# Patient Record
Sex: Male | Born: 1955 | Race: Black or African American | Hispanic: No | Marital: Single | Smoking: Never smoker
Health system: Southern US, Community
[De-identification: ages and names within clinical notes are randomized; demographics above are authoritative.]

## PROBLEM LIST (undated history)

## (undated) DIAGNOSIS — H35039 Hypertensive retinopathy, unspecified eye: Secondary | ICD-10-CM

## (undated) DIAGNOSIS — R6250 Unspecified lack of expected normal physiological development in childhood: Secondary | ICD-10-CM

## (undated) DIAGNOSIS — H269 Unspecified cataract: Secondary | ICD-10-CM

## (undated) DIAGNOSIS — G4730 Sleep apnea, unspecified: Secondary | ICD-10-CM

## (undated) DIAGNOSIS — R569 Unspecified convulsions: Secondary | ICD-10-CM

## (undated) DIAGNOSIS — E119 Type 2 diabetes mellitus without complications: Secondary | ICD-10-CM

## (undated) HISTORY — DX: Hypertensive retinopathy, unspecified eye: H35.039

## (undated) HISTORY — DX: Unspecified cataract: H26.9

---

## 2003-01-04 ENCOUNTER — Emergency Department (HOSPITAL_COMMUNITY): Admission: EM | Admit: 2003-01-04 | Discharge: 2003-01-04 | Payer: Self-pay | Admitting: Emergency Medicine

## 2003-01-05 ENCOUNTER — Emergency Department (HOSPITAL_COMMUNITY): Admission: EM | Admit: 2003-01-05 | Discharge: 2003-01-05 | Payer: Self-pay | Admitting: Emergency Medicine

## 2003-01-05 ENCOUNTER — Inpatient Hospital Stay (HOSPITAL_COMMUNITY): Admission: EM | Admit: 2003-01-05 | Discharge: 2003-01-07 | Payer: Self-pay | Admitting: Emergency Medicine

## 2003-01-06 ENCOUNTER — Encounter: Payer: Self-pay | Admitting: *Deleted

## 2012-02-09 DIAGNOSIS — R569 Unspecified convulsions: Secondary | ICD-10-CM | POA: Insufficient documentation

## 2013-02-01 DIAGNOSIS — E7800 Pure hypercholesterolemia, unspecified: Secondary | ICD-10-CM | POA: Insufficient documentation

## 2013-02-01 DIAGNOSIS — R5383 Other fatigue: Secondary | ICD-10-CM | POA: Insufficient documentation

## 2015-06-04 ENCOUNTER — Institutional Professional Consult (permissible substitution): Payer: Self-pay | Admitting: Pulmonary Disease

## 2015-07-17 ENCOUNTER — Encounter: Payer: Self-pay | Admitting: Pulmonary Disease

## 2015-07-17 ENCOUNTER — Ambulatory Visit (INDEPENDENT_AMBULATORY_CARE_PROVIDER_SITE_OTHER)
Admission: RE | Admit: 2015-07-17 | Discharge: 2015-07-17 | Disposition: A | Discharge status: Home / Self Care | Payer: Medicare Other | Source: Ambulatory Visit | Attending: Pulmonary Disease | Admitting: Pulmonary Disease

## 2015-07-17 ENCOUNTER — Ambulatory Visit (INDEPENDENT_AMBULATORY_CARE_PROVIDER_SITE_OTHER): Payer: Medicare Other | Admitting: Pulmonary Disease

## 2015-07-17 ENCOUNTER — Institutional Professional Consult (permissible substitution): Payer: Self-pay | Admitting: Pulmonary Disease

## 2015-07-17 VITALS — BP 128/78 | HR 90 | Ht 66.5 in | Wt 191.0 lb

## 2015-07-17 DIAGNOSIS — R569 Unspecified convulsions: Secondary | ICD-10-CM | POA: Diagnosis not present

## 2015-07-17 DIAGNOSIS — R0600 Dyspnea, unspecified: Secondary | ICD-10-CM

## 2015-07-17 DIAGNOSIS — R0609 Other forms of dyspnea: Secondary | ICD-10-CM | POA: Diagnosis not present

## 2015-07-17 DIAGNOSIS — G4733 Obstructive sleep apnea (adult) (pediatric): Secondary | ICD-10-CM

## 2015-07-17 NOTE — Progress Notes (Addendum)
Subjective:    Patient ID: Roy Young, male    DOB: 1955-04-15, 60 y.o.   MRN: [id number]  HPI   This is the case of Roy Young, 60 y.o. Male, who was referred by Dr. [name] in consultation regarding possible sarcoidosis. He is accompanied today by Lenoria Farrier who has been his caregiver since 1984. She spoke for him.   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 Also has sze problems as a child. He was dxed with OSA in 04/2015. He sees Dr. Rolla Etienne for szes and OSA. He uses cpap > no issues with it.   Szes have been stable. No szes x 3-4 yrs.   Non smoker. Denies any lung issues.  Per Octaviano Glow, sarcoidosis runs in the family and they want him to be checked out.          Review of Systems  Constitutional: Negative.  Negative for fever and unexpected weight change.  HENT: Negative.  Negative for congestion, dental problem, ear pain, nosebleeds, postnasal drip, rhinorrhea, sinus pressure, sneezing, sore throat and trouble swallowing.   Eyes: Negative.  Negative for redness and itching.  Respiratory: Positive for cough and shortness of breath. Negative for chest tightness and wheezing.   Cardiovascular: Negative.  Negative for palpitations and leg swelling.  Gastrointestinal: Negative.  Negative for nausea and vomiting.  Endocrine: Negative.   Genitourinary: Negative.  Negative for dysuria.  Musculoskeletal: Negative.  Negative for joint swelling.  Skin: Negative.  Negative for rash.  Allergic/Immunologic: Negative.   Neurological: Positive for seizures. Negative for headaches.  Hematological: Negative.  Does not bruise/bleed easily.  Psychiatric/Behavioral: Negative.  Negative for dysphoric mood. The patient is not nervous/anxious.    No past medical history on file.  Has IDD, sze d/o, OSA. On cpap since 04/2015.  No family history on file.  Sarcoidosis in mother's side. Father had CA. Mother is deceased. Broithers have  IDD.  Has 4 sisters who are OK.  No past surgical history on file. No surgery.  Social History   Social History  . Marital Status: Single    Spouse Name: N/A  . Number of Children: N/A  . Years of Education: N/A   Occupational History  . Not on file.   Social History Main Topics  . Smoking status: Never Smoker   . Smokeless tobacco: Not on file  . Alcohol Use: Not on file  . Drug Use: Not on file  . Sexual Activity: Not on file   Other Topics Concern  . Not on file   Social History Narrative  . No narrative on file   Single. Not working. Lives in a grp home. (-) smoking,drinking.   Independent with ADLs.   No Known Allergies   No outpatient prescriptions prior to visit.   No facility-administered medications prior to visit.   Meds ordered this encounter  Medications  . Lacosamide (VIMPAT) 100 MG TABS    Sig: Take 100 mg by mouth 2 (two) times daily.  . Multiple Vitamins-Minerals (MULTIVITAMIN ADULT PO)    Sig: Take by mouth.  . ergocalciferol (VITAMIN D2) 50000 units capsule    Sig: Take 50,000 Units by mouth once a week.  . lovastatin (MEVACOR) 40 MG tablet    Sig: Take 40 mg by mouth at bedtime.  Marland Kitchen loratadine (CLARITIN) 10 MG tablet    Sig: Take 10 mg by mouth daily.  Marland Kitchen levETIRAcetam (KEPPRA) 500 MG tablet  Sig: Take 500 mg by mouth 2 (two) times daily.  . carbamazepine (CARBATROL) 300 MG 12 hr capsule    Sig: Take 300 mg by mouth 2 (two) times daily.  . divalproex (DEPAKOTE) 250 MG DR tablet    Sig: Take 750 mg by mouth 3 (three) times daily.  . sertraline (ZOLOFT) 50 MG tablet    Sig: Take 50 mg by mouth daily.           Objective:   Physical Exam Vitals:  Filed Vitals:   07/17/15 1455  BP: 128/78  Pulse: 90  Height: 5' 6.5" (1.689 m)  Weight: 191 lb (86.637 kg)  SpO2: 93%    Constitutional/General:  Pleasant, well-nourished, well-developed, not in any distress,  Comfortably seating.  Well kempt  Body mass index is 30.37  kg/(m^2). Wt Readings from Last 3 Encounters:  07/17/15 191 lb (86.637 kg)      HEENT: Pupils equal and reactive to light and accommodation. Anicteric sclerae. Normal nasal mucosa.   No oral  lesions,  mouth clear,  oropharynx clear, no postnasal drip. (-) Oral thrush. No dental caries.  Airway - Mallampati class III  Neck: No masses. Midline trachea. No JVD, (-) LAD. (-) bruits appreciated.  Respiratory/Chest: Grossly normal chest. (-) deformity. (-) Accessory muscle use.  Symmetric expansion. (-) Tenderness on palpation.  Resonant on percussion.  Diminished BS on both lower lung zones. (-) wheezing, crackles, rhonchi (-) egophony  Cardiovascular: Regular rate and  rhythm, heart sounds normal, no murmur or gallops, no peripheral edema  Gastrointestinal:  Normal bowel sounds. Soft, non-tender. No hepatosplenomegaly.  (-) masses.   Musculoskeletal:  Normal muscle tone. Normal gait.   Extremities: Grossly normal. (-) clubbing, cyanosis.  (-) edema  Skin: (-) rash,lesions seen.   Neurological/Psychiatric : alert, oriented to time, place, person. Normal mood and affect             Assessment & Plan:  Exertional dyspnea Pt with recent SOB. Not sure if related to weight/RVD. Recent dx of OSA on cpap. Sarcoidosis runs in the family. Plan : 1. Plan for CXR. If abn, will need a chest ct scan.  Sister wants pt to be worked up for Sarcoidosis. If (+), will need a diagnostic bronch and will need blood work. If CXR is (-), maybe rpt CXR in 1 yr.   Seizure ([place]) Stable. Being seen by [facility]rology. sze free x 3 yrs.   OSA (obstructive sleep apnea) Mild OSA. AHI 10. On cpap of 8 cm H2O.  On cpap. [facility] neurology follows. Feels better using it. More energy. Less sleepiness.     Thank you very much for letting me participate in this patient's care. Please do not hesitate to give me a call if you have any questions or concerns regarding the treatment  plan.   Patient will follow up with me in 6 mos, sooner if Chest Xray is (+).     Monica Becton, MD 07/17/2015   4:09 PM [facility] Pager: [phone] Office: [phone], Fax: [phone]

## 2015-07-17 NOTE — Assessment & Plan Note (Signed)
Stable. Being seen by [facility] Neurology. sze free x 3 yrs.

## 2015-07-17 NOTE — Patient Instructions (Addendum)
1. We will order you a chest Xray and call you with results. Most likely, we will order you a chest CT scan if the x-ray is abnormal.  Return to clinic in 6 mos.

## 2015-07-17 NOTE — Assessment & Plan Note (Addendum)
Pt with recent SOB. Not sure if related to weight/RVD. Recent dx of OSA on cpap. Sarcoidosis runs in the family. Plan : 1. Plan for CXR. If abn, will need a chest ct scan.  Sister wants pt to be worked up for Sarcoidosis. If (+), will need a diagnostic bronch and will need blood work. If CXR is (-), maybe rpt CXR in 1 yr.

## 2015-07-17 NOTE — Assessment & Plan Note (Addendum)
Mild OSA. AHI 10. On cpap of 8 cm H2O.  On cpap. [facility] neurology follows. Feels better using it. More energy. Less sleepiness.

## 2016-01-30 ENCOUNTER — Encounter: Payer: Self-pay | Admitting: Pulmonary Disease

## 2016-01-30 ENCOUNTER — Ambulatory Visit (INDEPENDENT_AMBULATORY_CARE_PROVIDER_SITE_OTHER): Payer: Medicare Other | Admitting: Pulmonary Disease

## 2016-01-30 VITALS — BP 122/78 | HR 72 | Ht 66.5 in | Wt 192.0 lb

## 2016-01-30 DIAGNOSIS — R0609 Other forms of dyspnea: Secondary | ICD-10-CM | POA: Diagnosis not present

## 2016-01-30 DIAGNOSIS — G4733 Obstructive sleep apnea (adult) (pediatric): Secondary | ICD-10-CM

## 2016-01-30 NOTE — Progress Notes (Signed)
Subjective:    Patient ID: Roy Young, male    DOB: 1956/01/22, 60 y.o.   MRN: [id number]  HPI   This is the case of Roy Young, 60 y.o. Male, who was referred by Dr. Nolene Young in consultation regarding possible sarcoidosis. He is accompanied today by Roy Young who has been his caregiver since 1984. She spoke for him.   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 Also has sze problems as a child. He was dxed with OSA in 04/2015. He sees Roy Young for szes and OSA. He uses cpap > no issues with it.   Szes have been stable. No szes x 3-4 yrs.   Non smoker. Denies any lung issues.  Per Roy Young, sarcoidosis runs in the family and they want him to be checked out.    ROV 01/30/16 Patient returns to the office as follow-up on his "dyspnea". His brother accompanied him today. Per him, patient's dyspnea is significantly better. No issues overall. Has not been admitted nor been on antibiotics since last seen. Chest x-ray was unremarkable so no further tests were done. Patient has sleep apnea and uses CPAP. Per brother, Roy Young follows him for sleep apnea. Recent hypersomnia. No follow-up with Young recently. No seizures recently.   Review of Systems  Constitutional: Negative.  Negative for fever and unexpected weight change.  HENT: Negative.  Negative for congestion, dental problem, ear pain, nosebleeds, postnasal drip, rhinorrhea, sinus pressure, sneezing, sore throat and trouble swallowing.   Eyes: Negative.  Negative for redness and itching.  Respiratory: Occasional cough and dyspnea. Negative for chest tightness and wheezing.   Cardiovascular: Negative.  Negative for palpitations and leg swelling.  Gastrointestinal: Negative.  Negative for nausea and vomiting.  Endocrine: Negative.   Genitourinary: Negative.  Negative for dysuria.  Musculoskeletal: Negative.  Negative for joint swelling.  Skin: Negative.  Negative for  rash.  Allergic/Immunologic: Negative.   Neurological: Negative for seizures.. Negative for headaches.  Hematological: Negative.  Does not bruise/bleed easily.  Psychiatric/Behavioral: Negative.  Negative for dysphoric mood. The patient is not nervous/anxious.        Objective:   Physical Exam Vitals:  Vitals:   01/30/16 1131  BP: 122/78  Pulse: 72  SpO2: 98%  Weight: 192 lb (87.1 kg)  Height: 5' 6.5" (1.689 m)    Constitutional/General:  Pleasant, well-nourished, well-developed, not in any distress,  Comfortably seating.  Well kempt  Body mass index is 30.53 kg/m. Wt Readings from Last 3 Encounters:  01/30/16 192 lb (87.1 kg)  07/17/15 191 lb (86.6 kg)      HEENT: Pupils equal and reactive to light and accommodation. Anicteric sclerae. Normal nasal mucosa.   No oral  lesions,  mouth clear,  oropharynx clear, no postnasal drip. (-) Oral thrush. No dental caries.  Airway - Mallampati class III-IV  Neck: No masses. Midline trachea. No JVD, (-) LAD. (-) bruits appreciated.  Respiratory/Chest: Grossly normal chest. (-) deformity. (-) Accessory muscle use.  Symmetric expansion. (-) Tenderness on palpation.  Resonant on percussion.  Diminished BS on both lower lung zones. (-) wheezing, crackles, rhonchi (-) egophony  Cardiovascular: Regular rate and  rhythm, heart sounds normal, no murmur or gallops, no peripheral edema  Gastrointestinal:  Normal bowel sounds. Soft, non-tender. No hepatosplenomegaly.  (-) masses.   Musculoskeletal:  Normal muscle tone. Normal gait.   Extremities: Grossly normal. (-) clubbing, cyanosis.  (-) edema  Skin: (-) rash,lesions seen.  Neurological/Psychiatric : alert, followed simple commands.  Normal mood and affect.      Assessment & Plan:  Exertional dyspnea Pt with recent very mild SOB. Not sure if related to weight/RVD. Shortness of breath has been stable the last 6 months. Sarcoidosis runs in the family. Chest x-ray May  2017: Unremarkable. No significant lymphadenopathy seen.  Plan : 1. "Exertional dyspnea" is stable. Likely related to restrictive ventilatory defect/obesity. Although sarcoidosis runs in the family, chest x-ray in May, 2017 did not show any evidence of sarcoidosis. We held off on chest CT scan. Besides, he is not too symptomatic anyways. Plan to observe "exertional dyspnea" for now. Patient's sister wants him to be followed up at least once yearly. She is concerned about sarcoidosis. Maybe rpt  CXR on f/u.   Seizure (Roy Young) Stable. Being seen by Roy Young Young. sze free x 3 yrs.   OSA (obstructive sleep apnea) Mild OSA. AHI 10. On cpap of 8 cm H2O.  On cpap. Roy Young Young follows. Feels better using it. More energy. Recent sleepiness and hypersomnia per brother. I mentioned to his  brother to make sure he follows up with Roy Young who takes Young of his CPAP. We'll need a download and maybe adjusting his settings.   Patient will follow up with me in 12 mos.   Roy Becton, MD 01/30/2016, 11:59 AM Roy Young Pager [phone] After 3 pm or if no answer, call [phone]

## 2016-01-30 NOTE — Patient Instructions (Signed)
  It was a pleasure taking care of you today!  Continue using your CPAP machine. Follow up with Neurology regarding your OSA.   Please make sure you use your CPAP device everytime you sleep.   Return to clinic in 1 year  with Dr. Corrie Dandy.

## 2016-11-29 ENCOUNTER — Emergency Department (HOSPITAL_COMMUNITY): Payer: Medicare Other

## 2016-11-29 ENCOUNTER — Encounter (HOSPITAL_COMMUNITY): Payer: Self-pay | Admitting: Emergency Medicine

## 2016-11-29 ENCOUNTER — Emergency Department (HOSPITAL_COMMUNITY)
Admission: EM | Admit: 2016-11-29 | Discharge: 2016-11-30 | Disposition: A | Discharge status: Home / Self Care | Payer: Medicare Other | Attending: Emergency Medicine | Admitting: Emergency Medicine

## 2016-11-29 DIAGNOSIS — R2689 Other abnormalities of gait and mobility: Secondary | ICD-10-CM | POA: Insufficient documentation

## 2016-11-29 DIAGNOSIS — Z79899 Other long term (current) drug therapy: Secondary | ICD-10-CM | POA: Diagnosis not present

## 2016-11-29 DIAGNOSIS — S0990XA Unspecified injury of head, initial encounter: Secondary | ICD-10-CM | POA: Diagnosis not present

## 2016-11-29 DIAGNOSIS — Y999 Unspecified external cause status: Secondary | ICD-10-CM | POA: Diagnosis not present

## 2016-11-29 DIAGNOSIS — W07XXXA Fall from chair, initial encounter: Secondary | ICD-10-CM | POA: Insufficient documentation

## 2016-11-29 DIAGNOSIS — F79 Unspecified intellectual disabilities: Secondary | ICD-10-CM | POA: Diagnosis not present

## 2016-11-29 DIAGNOSIS — Y9389 Activity, other specified: Secondary | ICD-10-CM | POA: Insufficient documentation

## 2016-11-29 DIAGNOSIS — E119 Type 2 diabetes mellitus without complications: Secondary | ICD-10-CM | POA: Insufficient documentation

## 2016-11-29 DIAGNOSIS — W19XXXA Unspecified fall, initial encounter: Secondary | ICD-10-CM

## 2016-11-29 DIAGNOSIS — Y9210 Unspecified residential institution as the place of occurrence of the external cause: Secondary | ICD-10-CM | POA: Insufficient documentation

## 2016-11-29 HISTORY — DX: Unspecified lack of expected normal physiological development in childhood: R62.50

## 2016-11-29 HISTORY — DX: Unspecified convulsions: R56.9

## 2016-11-29 HISTORY — DX: Type 2 diabetes mellitus without complications: E11.9

## 2016-11-29 NOTE — ED Triage Notes (Signed)
Pt from home with c/o head pain on left temporal area following fall earlier this evening. Pt from group home where staff witnessed fall earlier. Pt does not respond as if in pain when area is palpated and there is no crepitus or swelling. Pt does not take blood thinners and did not experience LOC

## 2016-11-30 NOTE — ED Notes (Signed)
Pt ambulated to bathroom and back without any difficulties

## 2016-11-30 NOTE — Discharge Instructions (Signed)
CT scan of head and neck showed no fractures or head bleeding. 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 This could be causing his balance issues however may be due to medications. Make sure to follow up with her primary doctor [facility] appointment on Tuesday. Return to the ED if he develops any worsening symptoms.

## 2016-11-30 NOTE — ED Provider Notes (Signed)
[place] DEPT Provider Note   CSN: [id number] Arrival date & time: 11/29/16  1853     History   Chief Complaint Chief Complaint  Patient presents with  . Fall    HPI Roy Young is a 61 y.o. male.  HPI 61 year old African-American male past medical history significant for developmental delay, diabetes, seizures that presents to the emergency department today from a group home where he had a witnessed fall. Staff at bedside states the patient was getting up from dinner when he lost his balance and fell down hitting the left side of his head on the table. Patient has been ambulatory since the event. Denies any LOC or blood thinner use. Patient complain of head pain and left temporal area following the fall earlier. The caregiver says that over the past few weeks patient has been more off balance. She feels the patient is being overmedicated with his seizure medications. Denies any associated headaches, vision changes, lightheadedness, dizziness, seizure-like activity, aphasia, ataxia. Patient denies any neck pain, back pain, chest pain, abdominal pain Past Medical History:  Diagnosis Date  . Developmental delay   . Diabetes mellitus without complication ([place])   . Seizures [facility])     Patient Active Problem List   Diagnosis Date Noted  . Exertional dyspnea 07/17/2015  . OSA (obstructive sleep apnea) 07/17/2015  . Fatigue 02/01/2013  . Hypercholesterolemia 02/01/2013  . Seizure ([place]) 02/09/2012    History reviewed. No pertinent surgical history.     Home Medications    Prior to Admission medications   Medication Sig Start Date End Date Taking? Authorizing Provider  carbamazepine (CARBATROL) 300 MG 12 hr capsule Take 300 mg by mouth 2 (two) times daily.    [provider]  divalproex (DEPAKOTE) 250 MG DR tablet Take 750 mg by mouth 3 (three) times daily.    [provider]  ergocalciferol (VITAMIN D2) 50000 units capsule Take 50,000 Units by mouth  once a week.    [provider]  Lacosamide (VIMPAT) 100 MG TABS Take 100 mg by mouth 2 (two) times daily.    [provider]  levETIRAcetam (KEPPRA) 500 MG tablet Take 500 mg by mouth 2 (two) times daily.    [provider]  loratadine (CLARITIN) 10 MG tablet Take 10 mg by mouth daily.    [provider]  lovastatin (MEVACOR) 40 MG tablet Take 40 mg by mouth at bedtime.    [provider]  Multiple Vitamins-Minerals (MULTIVITAMIN ADULT PO) Take by mouth.    [provider]  sertraline (ZOLOFT) 50 MG tablet Take 50 mg by mouth daily.    [provider]    Family History No family history on file.  Social History Social History  Substance Use Topics  . Smoking status: Never Smoker  . Smokeless tobacco: Not on file  . Alcohol use No     Allergies   Patient has no known allergies.   Review of Systems Review of Systems  Constitutional: Negative for chills and fever.  Eyes: Negative for visual disturbance.  Cardiovascular: Negative for chest pain.  Gastrointestinal: Negative for abdominal pain.  Musculoskeletal: Positive for gait problem. Negative for arthralgias, back pain, joint swelling, myalgias, neck pain and neck stiffness.  Skin: Negative for color change and wound.  Neurological: Positive for headaches. Negative for dizziness, seizures, syncope, weakness, light-headedness and numbness.     Physical Exam Updated Vital Signs BP (!) 152/93 (BP Location: Right Arm)   Pulse 71   Temp  98.8 F (37.1 C) (Oral)   Resp 18   Ht 5\' 6"  (1.676 m)   Wt 83.9 kg (185 lb)   SpO2 97%   BMI 29.86 kg/m   Physical Exam Physical Exam  Constitutional: Pt is oriented to person, place, and time. Appears well-developed and well-nourished. No distress.  HENT:  Head: Normocephalic and atraumatic.  Ears: No bilateral hemotympanum. Nose: Nose normal. No septal hematoma. Mouth/Throat: Uvula is midline, oropharynx is clear and  moist and mucous membranes are normal.  Eyes: Conjunctivae and EOM are normal. Pupils are equal, round, and reactive to light.  Neck: No spinous process tenderness and no muscular tenderness present. No rigidity. Normal range of motion present.  Full ROM without pain No midline cervical tenderness No crepitus, deformity or step-offs  No paraspinal tenderness  Cardiovascular: Normal rate, regular rhythm and intact distal pulses.   Pulses:      Radial pulses are 2+ on the right side, and 2+ on the left side.       Dorsalis pedis pulses are 2+ on the right side, and 2+ on the left side.       Posterior tibial pulses are 2+ on the right side, and 2+ on the left side.  Pulmonary/Chest: Effort normal and breath sounds normal. No accessory muscle usage. No respiratory distress. No decreased breath sounds. No wheezes. No rhonchi. No rales. Exhibits no tenderness and no bony tenderness.   No flail segment, crepitus or deformity Equal chest expansion  Abdominal: Soft. Normal appearance and bowel sounds are normal. There is no tenderness. There is no rigidity, no guarding and no CVA tenderness.   Abd soft and nontender  Musculoskeletal: Normal range of motion.       Thoracic back: Exhibits normal range of motion.       Lumbar back: Exhibits normal range of motion.  Full range of motion of the T-spine and L-spine No tenderness to palpation of the spinous processes of the T-spine or L-spine No crepitus, deformity or step-offs No tenderness to palpation of the paraspinous muscles of the L-spine  Pelvis is stable. Lymphadenopathy:    Pt has no cervical adenopathy.  Neurological: Pt is alert and oriented to person, place, but not which is normal per facility staff member. Normal reflexes. No cranial nerve deficit. GCS eye subscore is 4. GCS verbal subscore is 5. GCS motor subscore is 6.  Reflex Scores:      Bicep reflexes are 2+ on the right side and 2+ on the left side.      Brachioradialis reflexes  are 2+ on the right side and 2+ on the left side.      Patellar reflexes are 2+ on the right side and 2+ on the left side.      Achilles reflexes are 2+ on the right side and 2+ on the left side. Speech is clear and goal oriented, follows commands Normal 5/5 strength in upper and lower extremities bilaterally including dorsiflexion and plantar flexion, strong and equal grip strength Sensation normal to light and sharp touch Moves extremities without ataxia, coordination intact Normal gait and balance No Clonus  Skin: Skin is warm and dry. No rash noted. Pt is not diaphoretic. No erythema.  Psychiatric: Normal mood and affect.  Nursing note and vitals reviewed.     ED Treatments / Results  Labs (all labs ordered are listed, but only abnormal results are displayed) Labs Reviewed - No data to display  EKG  EKG Interpretation None  Radiology Ct Head Wo Contrast  Result Date: 11/30/2016 CLINICAL DATA:  Witnessed fall in group home. No loss of consciousness. LEFT head pain. EXAM: CT HEAD WITHOUT CONTRAST CT CERVICAL SPINE WITHOUT CONTRAST TECHNIQUE: Multidetector CT imaging of the head and cervical spine was performed following the standard protocol without intravenous contrast. Multiplanar CT image reconstructions of the cervical spine were also generated. COMPARISON:  CT HEAD January 05, 2003 FINDINGS: CT HEAD FINDINGS BRAIN: No intraparenchymal hemorrhage, mass effect nor midline shift. Small area LEFT frontal encephalomalacia stable from 2004. New RIGHT occipital lobe encephalomalacia. Mild parenchymal brain volume loss. No hydrocephalus. Patchy supratentorial white matter hypodensities. No acute large vascular territory infarct. No abnormal extra-axial fluid collections. VASCULAR: Moderate calcific atherosclerosis of the carotid siphons. SKULL: No skull fracture. No significant scalp soft tissue swelling. SINUSES/ORBITS: The mastoid air-cells and included paranasal sinuses are  well-aerated.The included ocular globes and orbital contents are non-suspicious. OTHER: None. CT CERVICAL SPINE FINDINGS ALIGNMENT: Straightened lordosis. Vertebral bodies in alignment. SKULL BASE AND VERTEBRAE: Cervical vertebral bodies and posterior elements are intact. Intervertebral disc heights preserved. No destructive bony lesions. C1-2 articulation maintained. Mild LEFT mid cervical facet arthropathy. SOFT TISSUES AND SPINAL CANAL: Nonacute. Mild calcific atherosclerosis carotid bifurcations. DISC LEVELS: No significant osseous canal stenosis. Moderate C7-T1 neural foraminal narrowing. UPPER CHEST: Lung apices are clear. OTHER: None. IMPRESSION: CT HEAD: 1. No acute intracranial process. 2. 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 3. Mild parenchymal brain volume loss. 4. Mild chronic small vessel ischemic disease. CT CERVICAL SPINE: 1. No acute fracture or malalignment. Electronically Signed   By: Elon Alas M.D.   On: 11/30/2016 00:36   Ct Cervical Spine Wo Contrast  Result Date: 11/30/2016 CLINICAL DATA:  Witnessed fall in group home. No loss of consciousness. LEFT head pain. EXAM: CT HEAD WITHOUT CONTRAST CT CERVICAL SPINE WITHOUT CONTRAST TECHNIQUE: Multidetector CT imaging of the head and cervical spine was performed following the standard protocol without intravenous contrast. Multiplanar CT image reconstructions of the cervical spine were also generated. COMPARISON:  CT HEAD January 05, 2003 FINDINGS: CT HEAD FINDINGS BRAIN: No intraparenchymal hemorrhage, mass effect nor midline shift. Small area LEFT frontal encephalomalacia stable from 2004. New RIGHT occipital lobe encephalomalacia. Mild parenchymal brain volume loss. No hydrocephalus. Patchy supratentorial white matter hypodensities. No acute large vascular territory infarct. No abnormal extra-axial fluid collections. VASCULAR: Moderate calcific atherosclerosis of the carotid  siphons. SKULL: No skull fracture. No significant scalp soft tissue swelling. SINUSES/ORBITS: The mastoid air-cells and included paranasal sinuses are well-aerated.The included ocular globes and orbital contents are non-suspicious. OTHER: None. CT CERVICAL SPINE FINDINGS ALIGNMENT: Straightened lordosis. Vertebral bodies in alignment. SKULL BASE AND VERTEBRAE: Cervical vertebral bodies and posterior elements are intact. Intervertebral disc heights preserved. No destructive bony lesions. C1-2 articulation maintained. Mild LEFT mid cervical facet arthropathy. SOFT TISSUES AND SPINAL CANAL: Nonacute. Mild calcific atherosclerosis carotid bifurcations. DISC LEVELS: No significant osseous canal stenosis. Moderate C7-T1 neural foraminal narrowing. UPPER CHEST: Lung apices are clear. OTHER: None. IMPRESSION: CT HEAD: 1. No acute intracranial process. 2. 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 3. Mild parenchymal brain volume loss. 4. Mild chronic small vessel ischemic disease. CT CERVICAL SPINE: 1. No acute fracture or malalignment. Electronically Signed   By: Elon Alas M.D.   On: 11/30/2016 00:36    Procedures Procedures (including critical care time)  Medications Ordered in ED Medications - No data to display  Initial Impression / Assessment and Plan / ED Course  I have reviewed the triage vital signs and the nursing notes.  Pertinent labs & imaging results that were available during my care of the patient were reviewed by me and considered in my medical decision making (see chart for details).     Patient presents to the ED from a group home with staff at bedside for a witnessed fall. Patient complains of a headache. No other focal neuro deficits on exam. Patient is neurovascularly intact in all extremities. Staff member feels that patient's balance has been off for the past 3-4 weeks and she feels like he is being overmedicated.  Patient was ambulated in the ED without any difficulties. CT scan of head and neck were obtained. Showed no acute abnormality does show old infarct. Patient and nursing staff was updated on the findings. This may be a cause of patient being off balance however could be due to medication. Has an appointment with his PCP on Tuesday. Encouraged him to keep his appointment and voice their concerns with his medications and his CT findings. Patient does have a neurologist. Vital signs are reassuring. Patient is overall well-appearing and nontoxic. Ambulate with normal gait. No signs of intrathoracic or intra-abdominal trauma.  Pt is hemodynamically stable, in NAD, & able to ambulate in the ED. Evaluation does not show pathology that would require ongoing emergent intervention or inpatient treatment. I explained the diagnosis to the patient. Pain has been managed & has no complaints prior to dc. Pt is comfortable with above plan and is stable for discharge at this time. All questions were answered prior to disposition. Strict return precautions for f/u to the ED were discussed. Encouraged follow up with PCP.  Patient seen and evaluated with my attending Dr. Randal Buba who is agreeable to the above plan.    Final Clinical Impressions(s) / ED Diagnoses   Final diagnoses:  Fall, initial encounter  Injury of head, initial encounter    New Prescriptions New Prescriptions   No medications on file     Aaron Edelman 11/30/16 0159    Palumbo, April, MD 11/30/16 [phone]

## 2016-11-30 NOTE — ED Notes (Signed)
Pt ambulated well in the hall and to the bathroom without difficulty---- pt's gait and balance steady.

## 2017-02-09 ENCOUNTER — Encounter: Payer: Self-pay | Admitting: Adult Health

## 2017-02-09 ENCOUNTER — Ambulatory Visit (INDEPENDENT_AMBULATORY_CARE_PROVIDER_SITE_OTHER): Payer: Medicare Other | Admitting: Adult Health

## 2017-02-09 VITALS — BP 116/74 | HR 75 | Ht 67.0 in | Wt 171.8 lb

## 2017-02-09 DIAGNOSIS — G4733 Obstructive sleep apnea (adult) (pediatric): Secondary | ICD-10-CM

## 2017-02-09 NOTE — Progress Notes (Signed)
@Patient  ID: Roy Young, male    DOB: [birth year], 61 y.o.   MRN: [id number]  Chief Complaint  Patient presents with  . Follow-up    Referring provider: [name], MD  HPI: 61 yo male with seizure disorder and mentally challenged followed for OSA on CPAP   02/09/2017 Follow up: CPAP  Pt presents for a 6 month follow up . He is accompanied by his caregiver. He is at a group home. She says he does very well on CPAP . Sleeps each night with CPAP on all night . He appears rested in am. No issues with mask or machine. Gets supplies on time with DME.  No download available.    No Known Allergies  Immunization History  Administered Date(s) Administered  . Influenza [place] 03/27/2015, 12/14/2016    Past Medical History:  Diagnosis Date  . Developmental delay   . Diabetes mellitus without complication ([place])   . Seizures ([place])     Tobacco History: Social History   Tobacco Use  Smoking Status Never Smoker  Smokeless Tobacco Never Used   Counseling given: Not Answered   Outpatient Encounter Medications as of 02/09/2017  Medication Sig  . carbamazepine (CARBATROL) 300 MG 12 hr capsule Take 300 mg by mouth 2 (two) times daily.  . divalproex (DEPAKOTE) 250 MG DR tablet Take 750 mg by mouth 3 (three) times daily.  . ergocalciferol (VITAMIN D2) 50000 units capsule Take 50,000 Units by mouth once a week.  . Lacosamide (VIMPAT) 100 MG TABS Take 100 mg by mouth 2 (two) times daily.  Marland Kitchen levETIRAcetam (KEPPRA) 500 MG tablet Take 500 mg by mouth 2 (two) times daily.  Marland Kitchen loratadine (CLARITIN) 10 MG tablet Take 10 mg by mouth daily.  Marland Kitchen lovastatin (MEVACOR) 40 MG tablet Take 40 mg by mouth at bedtime.  . Multiple Vitamins-Minerals (MULTIVITAMIN ADULT PO) Take by mouth.  . sertraline (ZOLOFT) 50 MG tablet Take 50 mg by mouth daily.   No facility-administered encounter medications on file as of 02/09/2017.      Review of Systems  Constitutional:   No  weight loss, night  sweats,  Fevers, chills, fatigue, or  lassitude.    Physical Exam  BP 116/74 (BP Location: Right Arm, Cuff Size: Normal)   Pulse 75   Ht 5\' 7"  (1.702 m)   Wt 171 lb 12.8 oz (77.9 kg)   SpO2 100%   BMI 26.91 kg/m   GEN: A/Ox3; pleasant , NAD , mentally challenged, limited speech    HEENT:  [place]/AT,  THROAT-clear, no lesions, no postnasal drip or exudate noted. Class 3 MP airway , hearing aids  NECK:  Supple w/ fair ROM; no JVD; normal carotid impulses w/o bruits; no thyromegaly or nodules palpated; no lymphadenopathy.    RESP  Clear  P & A; w/o, wheezes/ rales/ or rhonchi. no accessory muscle use, no dullness to percussion  CARD:  RRR, no m/r/g, no peripheral edema, pulses intact, no cyanosis or clubbing.  GI:   Soft & nt; nml bowel sounds; no organomegaly or masses detected.   Musco: Warm bil, no deformities or joint swelling noted.   Skin: Warm, no lesions or rashes    Lab Results:  CBC No results found for: WBC, RBC, HGB, HCT, PLT, MCV, MCH, MCHC, RDW, LYMPHSABS, MONOABS, EOSABS, BASOSABS  BMET No results found for: NA, K, CL, CO2, GLUCOSE, BUN, CREATININE, CALCIUM, GFRNONAA, GFRAA  BNP No results found for: BNP  ProBNP No results found for: PROBNP  Imaging: No results found.   Assessment & Plan:   OSA (obstructive sleep apnea) Well controlled on CPAP  Download requested   Plan  Patient Instructions  Continue on CPAP At bedtime   Keep up good work.  Download and SD card have been requested.  Follow up with Dr. Elsworth Soho  In 1 year and As needed          Rexene Edison, NP 02/09/2017

## 2017-02-09 NOTE — Patient Instructions (Signed)
Continue on CPAP At bedtime   Keep up good work.  Download and SD card have been requested.  Follow up with Dr. Elsworth Soho  In 1 year and As needed

## 2017-02-09 NOTE — Assessment & Plan Note (Signed)
Well controlled on CPAP  Download requested   Plan  Patient Instructions  Continue on CPAP At bedtime   Keep up good work.  Download and SD card have been requested.  Follow up with Dr. Elsworth Soho  In 1 year and As needed

## 2017-02-14 ENCOUNTER — Encounter: Payer: Self-pay | Admitting: Adult Health

## 2017-02-19 ENCOUNTER — Telehealth: Payer: Self-pay | Admitting: Adult Health

## 2017-02-19 NOTE — Telephone Encounter (Signed)
Pt seen by TP on 11.27.18 for OSA follow up - download was unavailable Received 01/16/17 - 02/14/17 download from [facility] Per TP: control ok, no changes.  Does show some mask leaks.  LMOM TCB x1 for pt to discuss results

## 2017-02-24 NOTE — Telephone Encounter (Signed)
Pt lives in a group home Called spoke with his caretaker Ms. Coltrane who accompanied pt to his appt She did receive the message, discussed with her again Nothing further needed; will sign off

## 2018-02-01 ENCOUNTER — Ambulatory Visit: Payer: Medicare Other | Admitting: Pulmonary Disease

## 2018-02-08 ENCOUNTER — Ambulatory Visit: Payer: Medicare Other | Admitting: Pulmonary Disease

## 2018-03-29 ENCOUNTER — Ambulatory Visit: Payer: Medicare Other | Admitting: Pulmonary Disease

## 2018-04-05 ENCOUNTER — Ambulatory Visit: Payer: Medicare Other | Admitting: Pulmonary Disease

## 2018-04-26 ENCOUNTER — Ambulatory Visit: Payer: Medicare Other | Admitting: Pulmonary Disease

## 2018-05-10 ENCOUNTER — Ambulatory Visit: Payer: Medicare Other | Admitting: Pulmonary Disease

## 2018-05-24 ENCOUNTER — Encounter: Payer: Self-pay | Admitting: Pulmonary Disease

## 2018-05-24 ENCOUNTER — Ambulatory Visit (INDEPENDENT_AMBULATORY_CARE_PROVIDER_SITE_OTHER): Payer: Medicare HMO | Admitting: Pulmonary Disease

## 2018-05-24 VITALS — BP 112/62 | HR 74 | Ht 67.0 in | Wt 180.0 lb

## 2018-05-24 DIAGNOSIS — G4733 Obstructive sleep apnea (adult) (pediatric): Secondary | ICD-10-CM | POA: Diagnosis not present

## 2018-05-24 NOTE — Assessment & Plan Note (Signed)
CPAP machine is on auto settings 8-14 cm  and is working well. CPAP supplies will be renewed for a year  Weight loss encouraged, compliance with goal of at least 4-6 hrs every night is the expectation. Advised against medications with sedative side effects Cautioned against driving when sleepy - understanding that sleepiness will vary on a day to day basis

## 2018-05-24 NOTE — Progress Notes (Signed)
   Subjective:    Patient ID: Roy Young, male    DOB: June 12, 1955, 63 y.o.   MRN: [id number]  HPI  63 yo group home resident with seizure disorder and mild MR followed for OSA on CPAP  Per report, his PSG showed mild OSA AHI 10/hour. He has done well on CPAP, auto settings 8 to 14 cm with full facemask. He lives with a group home and is accompanied by his Freight forwarder today. He is very compliant with this machine and this has really helped him.  He does take daytime naps in his recliner and his snoring is loud enough to where everyone can hear.  He is able to wear the mask by himself but he has severe tardive dyskinesia and he needs someone to put water into his humidifier. CPAP download shows excellent compliance only 3 missed nights out of last month with average pressure 11 cm and good control of events with moderate leak.  Seizures are controlled on 4 medications for the past few years He was evaluated for sarcoidosis and chest x-ray noted to be normal 07/2015  Past Medical History:  Diagnosis Date  . Developmental delay   . Diabetes mellitus without complication ([place])   . Seizures ([place])      Review of Systems neg for any significant sore throat, dysphagia, itching, sneezing, nasal congestion or excess/ purulent secretions, fever, chills, sweats, unintended wt loss, pleuritic or exertional cp, hempoptysis, orthopnea pnd or change in chronic leg swelling. Also denies presyncope, palpitations, heartburn, abdominal pain, nausea, vomiting, diarrhea or change in bowel or urinary habits, dysuria,hematuria, rash, arthralgias, visual complaints, headache, numbness weakness or ataxia.     Objective:   Physical Exam   Gen. Pleasant, well-nourished, in no distress ENT - no thrush, no pallor/icterus,no post nasal drip, class 2 airway  Neck: No JVD, no thyromegaly, no carotid bruits Lungs: no use of accessory muscles, no dullness to percussion, clear without rales or rhonchi    Cardiovascular: Rhythm regular, heart sounds  normal, no murmurs or gallops, no peripheral edema Musculoskeletal: No deformities, no cyanosis or clubbing  Neuro-tremors, cogwheel rigidity, no deformity        Assessment & Plan:

## 2018-05-24 NOTE — Patient Instructions (Signed)
CPAP machine is on auto settings and is working well. CPAP supplies will be renewed for a year

## 2018-11-28 NOTE — Progress Notes (Signed)
[facility] Note  11/29/2018     CHIEF COMPLAINT Patient presents for Retina Evaluation   HISTORY OF PRESENT ILLNESS: Roy Young is a 63 y.o. male who presents to the clinic today for:   HPI    Retina Evaluation    In right eye.  Onset: Unknown.  Duration of 2 weeks.  Associated Symptoms Distortion.  Context:  distance vision, mid-range vision and near vision.  Treatments tried include no treatments.  I, the attending physician,  performed the HPI with the patient and updated documentation appropriately.          Comments    63 y/o male pt referred by Dr. Parke Simmers for eval of ERM w/mac hole OD.  Saw Dr. Parke Simmers about 2 wks ago for routine eye exam when issue was discovered.  Pt reports no issues with his vision, and denies pain, flashes, floaters, but according to caregiver, who is with patient today, pt is mostly non-verbal due to cognitive issues.  No gtts.       Last edited by [name], MD on 11/29/2018 11:02 PM. (History)    pt is with his caregiver today, she states he lives in a group home, but is his own POA, pt is mostly non-verbal due to cognitive function, pt was referred by Dr. Parke Simmers for a mac hole in his right eye, caregiver states pt has never had sx of any type, caregiver states she took him to see Dr. Parke Simmers for a routine eye exam, she states he was not complaining of any vision issues  Referring physician: [name], [name], [address],  [place] [address]  HISTORICAL INFORMATION:   Selected notes from the [facility] Referred by Dr. [name] for concern of ERM with mac hole OD LEE: 09.01.20 (J. [name]) [BCVA: OD: 20/60--, OS: 20/0--] Ocular Hx- PMH-developmental delay, DM, seizures   CURRENT MEDICATIONS: No current outpatient medications on file. (Ophthalmic Drugs)   No current facility-administered medications for this visit.  (Ophthalmic Drugs)   Current Outpatient Medications (Other)   Medication Sig  . carbamazepine (CARBATROL) 300 MG 12 hr capsule Take 300 mg by mouth 2 (two) times daily.  . divalproex (DEPAKOTE) 250 MG DR tablet Take 750 mg by mouth 3 (three) times daily.  . ergocalciferol (VITAMIN D2) 50000 units capsule Take 50,000 Units by mouth once a week.  . Lacosamide (VIMPAT) 100 MG TABS Take 100 mg by mouth 2 (two) times daily.  Marland Kitchen levETIRAcetam (KEPPRA) 500 MG tablet Take 500 mg by mouth 2 (two) times daily.  Marland Kitchen loratadine (CLARITIN) 10 MG tablet Take 10 mg by mouth daily.  Marland Kitchen lovastatin (MEVACOR) 40 MG tablet Take 40 mg by mouth at bedtime.  . Multiple Vitamin (MULTI-VITAMIN) tablet Take by mouth.  . Multiple Vitamins-Minerals (MULTIVITAMIN ADULT PO) Take by mouth.  . propranolol (INDERAL) 10 MG tablet Take 10 mg by mouth 3 (three) times daily.  . sertraline (ZOLOFT) 50 MG tablet Take 50 mg by mouth daily.   No current facility-administered medications for this visit.  (Other)      REVIEW OF SYSTEMS: ROS    Positive for: Neurological, Eyes, Respiratory   Negative for: Constitutional, Gastrointestinal, Skin, Genitourinary, Musculoskeletal, HENT, Endocrine, Cardiovascular, Psychiatric, Allergic/Imm, Heme/Lymph   Last edited by Matthew Folks, COA on 11/29/2018  9:10 AM. (History)       ALLERGIES No Known Allergies  PAST MEDICAL HISTORY Past Medical History:  Diagnosis Date  . Developmental delay   .  Diabetes mellitus without complication ([place])   . Seizures ([place])    History reviewed. No pertinent surgical history.  FAMILY HISTORY History reviewed. No pertinent family history.  SOCIAL HISTORY Social History   Tobacco Use  . Smoking status: Never Smoker  . Smokeless tobacco: Never Used  Substance Use Topics  . Alcohol use: No    Alcohol/week: 0.0 standard drinks  . Drug use: Not on file         OPHTHALMIC EXAM:  Base Eye Exam    Visual Acuity (Snellen - Linear)      Right Left   Dist cc 20/80 -2 20/40 -2   Dist ph cc NI NI    Correction: Glasses       Tonometry (Tonopen, 9:31 AM)      Right Left   Pressure 15 16  Squeezing       Pupils      Dark Light Shape React APD   Right 3 2 Round Minimal None   Left 3 2 Round Minimal None       Visual Fields (Counting fingers)      Left Right    Full Full       Extraocular Movement      Right Left    Full, Ortho Full, Ortho       Neuro/Psych    Oriented x3: Yes   Mood/Affect: Normal       Dilation    Both eyes: 1.0% Mydriacyl, 2.5% Phenylephrine @ 9:31 AM        Slit Lamp and Fundus Exam    Slit Lamp Exam      Right Left   Lids/Lashes Dermatochalasis - upper lid, mild Meibomian gland dysfunction Dermatochalasis - upper lid, mild Meibomian gland dysfunction   Conjunctiva/Sclera White and quiet, mild melanosis White and quiet, mild melanosis   Cornea trace Punctate epithelial erosions trace Punctate epithelial erosions   Anterior Chamber Deep and quiet Deep and quiet   Iris Round and dilated Round and dilated   Lens 2+ Nuclear sclerosis, 2+ Cortical cataract 2+ Nuclear sclerosis, 2+ Cortical cataract   Vitreous Mild Vitreous syneresis Mild Vitreous syneresis       Fundus Exam      Right Left   Disc Pink and Sharp, Peripapillary atrophy Pink and Sharp   C/D Ratio 0.4 0.5   Macula Full thickness Macular hole with surrounding CME, no heme Flat, Good foveal reflex, Retinal pigment epithelial mottling, No heme or edema   Vessels Vascular attenuation, Copper wiring, AV crossing changes Vascular attenuation, Copper wiring, AV crossing changes   Periphery Attached, No heme  Attached, No heme         Refraction    Wearing Rx      Sphere Cylinder Axis Add   Right -0.25 +1.25 013 +2.25   Left -0.75 +1.00 170 +2.25   Age: 9yr   Type: Bifocal       Manifest Refraction   Unable to refract due to cognitive issues.          IMAGING AND PROCEDURES  Imaging and Procedures for _0 @  OCT, Retina - OU - Both Eyes       Right Eye Quality  was good. Central Foveal Thickness: 375. Progression has no prior data. Findings include abnormal foveal contour, intraretinal fluid, macular hole, no SRF (Partial PVD).   Left Eye Quality was good. Central Foveal Thickness: 255. Progression has no prior data. Findings include normal foveal contour, no IRF, no SRF, vitreomacular  adhesion .   Notes *Images captured and stored on drive  Diagnosis / Impression:  OD: FTMH with surrounding IRF/CME OS: NFP, no IRF/SRF, VMA  Clinical management:  See below  Abbreviations: NFP - Normal foveal profile. CME - cystoid macular edema. PED - pigment epithelial detachment. IRF - intraretinal fluid. SRF - subretinal fluid. EZ - ellipsoid zone. ERM - epiretinal membrane. ORA - outer retinal atrophy. ORT - outer retinal tubulation. SRHM - subretinal hyper-reflective material                 ASSESSMENT/PLAN:    ICD-10-CM   1. Macular hole of right eye  H35.341   2. Retinal edema  H35.81 OCT, Retina - OU - Both Eyes  3. Essential hypertension  I10   4. Hypertensive retinopathy of both eyes  H35.033   5. Combined forms of age-related cataract of both eyes  H25.813     1,2. Full thickness mac hole OD - The natural history, anatomy, potential for loss of vision, and treatment options including vitrectomy techniques and the complications of endophthalmitis, retinal detachment, vitreous hemorrhage, cataract progression and permanent vision loss discussed with the patient. - recommend 25g PPV+ICG/MP/Gas OD under general anesthesia - RBA of procedure discussed w/ patient and caregiver and pt's MPOA (sister in [place], [place] -- spoke via telephone), questions answered - pt with mental retardation and lives in a group home of 6 residents and 3 care givers - discussed concerns about pt's ability to position post-operatively - MPOA (sister) to speak with family before scheduling surgery - f/u in 4 weeks -- can return sooner if ready to schedule  surgery  3,4. Hypertensive retinopathy OU  - discussed importance of tight BP control  - monitor  5. Mixed form age related cataract OU  - The symptoms of cataract, surgical options, and treatments and risks were discussed with patient.  - discussed diagnosis and progression  - not yet visually significant  - monitor for now   Ophthalmic Meds Ordered this visit:  No orders of the defined types were placed in this encounter.      Return in about 4 weeks (around 12/27/2018) for f/u Angus OD, DFE, OCT.  There are no Patient Instructions on file for this visit.   Explained the diagnoses, plan, and follow up with the patient and they expressed understanding.  Patient expressed understanding of the importance of proper follow up care.   This document serves as a record of services personally performed by Gardiner Sleeper, MD, PhD. It was created on their behalf by Ernest Mallick, OA, an ophthalmic assistant. The creation of this record is the provider's dictation and/or activities during the visit.    Electronically signed by: Ernest Mallick, OA 09.14.2020 11:05 PM    Gardiner Sleeper, M.D., Ph.D. Diseases & Surgery of the Retina and Vitreous [facility]  I have reviewed the above documentation for accuracy and completeness, and I agree with the above. Gardiner Sleeper, M.D., Ph.D. 11/29/18 11:13 PM   Abbreviations: M myopia (nearsighted); A astigmatism; H hyperopia (farsighted); P presbyopia; Mrx spectacle prescription;  CTL contact lenses; OD right eye; OS left eye; OU both eyes  XT exotropia; ET esotropia; PEK punctate epithelial keratitis; PEE punctate epithelial erosions; DES dry eye syndrome; MGD meibomian gland dysfunction; ATs artificial tears; PFAT's preservative free artificial tears; [place] nuclear sclerotic cataract; PSC posterior subcapsular cataract; ERM epi-retinal membrane; PVD posterior vitreous detachment; RD retinal detachment; DM diabetes mellitus; DR  diabetic  retinopathy; NPDR non-proliferative diabetic retinopathy; PDR proliferative diabetic retinopathy; CSME clinically significant macular edema; DME diabetic macular edema; dbh dot blot hemorrhages; CWS cotton wool spot; POAG primary open angle glaucoma; C/D cup-to-disc ratio; HVF humphrey visual field; GVF goldmann visual field; OCT optical coherence tomography; IOP intraocular pressure; BRVO Branch retinal vein occlusion; CRVO central retinal vein occlusion; CRAO central retinal artery occlusion; BRAO branch retinal artery occlusion; RT retinal tear; SB scleral buckle; PPV pars plana vitrectomy; VH Vitreous hemorrhage; PRP panretinal laser photocoagulation; IVK intravitreal kenalog; VMT vitreomacular traction; MH Macular hole;  NVD neovascularization of the disc; NVE neovascularization elsewhere; AREDS age related eye disease study; ARMD age related macular degeneration; POAG primary open angle glaucoma; EBMD epithelial/anterior basement membrane dystrophy; ACIOL anterior chamber intraocular lens; IOL intraocular lens; PCIOL posterior chamber intraocular lens; Phaco/IOL phacoemulsification with intraocular lens placement; Burns photorefractive keratectomy; LASIK laser assisted in situ keratomileusis; HTN hypertension; DM diabetes mellitus; COPD chronic obstructive pulmonary disease

## 2018-11-29 ENCOUNTER — Other Ambulatory Visit: Payer: Self-pay

## 2018-11-29 ENCOUNTER — Ambulatory Visit (INDEPENDENT_AMBULATORY_CARE_PROVIDER_SITE_OTHER): Payer: Medicare HMO | Admitting: Ophthalmology

## 2018-11-29 ENCOUNTER — Encounter (INDEPENDENT_AMBULATORY_CARE_PROVIDER_SITE_OTHER): Payer: Self-pay | Admitting: Ophthalmology

## 2018-11-29 DIAGNOSIS — H35033 Hypertensive retinopathy, bilateral: Secondary | ICD-10-CM

## 2018-11-29 DIAGNOSIS — H35341 Macular cyst, hole, or pseudohole, right eye: Secondary | ICD-10-CM | POA: Diagnosis not present

## 2018-11-29 DIAGNOSIS — H25813 Combined forms of age-related cataract, bilateral: Secondary | ICD-10-CM

## 2018-11-29 DIAGNOSIS — I10 Essential (primary) hypertension: Secondary | ICD-10-CM | POA: Diagnosis not present

## 2018-11-29 DIAGNOSIS — H3581 Retinal edema: Secondary | ICD-10-CM | POA: Diagnosis not present

## 2018-12-09 ENCOUNTER — Other Ambulatory Visit: Payer: Self-pay

## 2018-12-09 ENCOUNTER — Encounter (INDEPENDENT_AMBULATORY_CARE_PROVIDER_SITE_OTHER): Payer: Self-pay | Admitting: Ophthalmology

## 2018-12-09 ENCOUNTER — Ambulatory Visit (INDEPENDENT_AMBULATORY_CARE_PROVIDER_SITE_OTHER): Payer: Medicare HMO | Admitting: Ophthalmology

## 2018-12-09 DIAGNOSIS — H25813 Combined forms of age-related cataract, bilateral: Secondary | ICD-10-CM

## 2018-12-09 DIAGNOSIS — H35033 Hypertensive retinopathy, bilateral: Secondary | ICD-10-CM

## 2018-12-09 DIAGNOSIS — H35341 Macular cyst, hole, or pseudohole, right eye: Secondary | ICD-10-CM | POA: Diagnosis not present

## 2018-12-09 DIAGNOSIS — I10 Essential (primary) hypertension: Secondary | ICD-10-CM

## 2018-12-09 DIAGNOSIS — H3581 Retinal edema: Secondary | ICD-10-CM

## 2018-12-09 NOTE — Progress Notes (Signed)
[facility] Note  12/09/2018     CHIEF COMPLAINT Patient presents for Retina Follow Up   HISTORY OF PRESENT ILLNESS: Roy Young is a 63 y.o. male who presents to the clinic today for:   HPI    Retina Follow Up    Patient presents with  Other ([place]).  In right eye.  Severity is moderate.  Duration of 1.5 weeks.  Since onset it is stable.  I, the attending physician,  performed the HPI with the patient and updated documentation appropriately.          Comments    Patient states vision the same OU. Patient's BS was 104 this am. Last a1c was 5.8 on 07.07.20. Patient's sister has had macular hole repair.        Last edited by [name], MD on 12/09/2018 11:57 AM. (History)    pt is with his caregiver today   Referring physician: [name], MD [place],  [place] [address]  HISTORICAL INFORMATION:   Selected notes from the [facility] Referred by Dr. [name] for concern of ERM with mac hole OD LEE: 09.01.20 (J. [name]) [BCVA: OD: 20/60--, OS: 20/0--] Ocular Hx- PMH-developmental delay, DM, seizures   CURRENT MEDICATIONS: No current outpatient medications on file. (Ophthalmic Drugs)   No current facility-administered medications for this visit.  (Ophthalmic Drugs)   Current Outpatient Medications (Other)  Medication Sig  . carbamazepine (CARBATROL) 300 MG 12 hr capsule Take 300 mg by mouth 2 (two) times daily.  . divalproex (DEPAKOTE) 250 MG DR tablet Take 750 mg by mouth 3 (three) times daily.  . ergocalciferol (VITAMIN D2) 50000 units capsule Take 50,000 Units by mouth once a week.  . Lacosamide (VIMPAT) 100 MG TABS Take 100 mg by mouth 2 (two) times daily.  Marland Kitchen levETIRAcetam (KEPPRA) 500 MG tablet Take 500 mg by mouth 2 (two) times daily.  Marland Kitchen loratadine (CLARITIN) 10 MG tablet Take 10 mg by mouth daily.  Marland Kitchen lovastatin (MEVACOR) 40 MG tablet Take 40 mg by mouth at bedtime.  . Multiple Vitamin  (MULTI-VITAMIN) tablet Take by mouth.  . Multiple Vitamins-Minerals (MULTIVITAMIN ADULT PO) Take by mouth.  . propranolol (INDERAL) 10 MG tablet Take 10 mg by mouth 3 (three) times daily.  . sertraline (ZOLOFT) 50 MG tablet Take 50 mg by mouth daily.   No current facility-administered medications for this visit.  (Other)      REVIEW OF SYSTEMS: ROS    Positive for: Neurological, Eyes, Respiratory   Negative for: Constitutional, Gastrointestinal, Skin, Genitourinary, Musculoskeletal, HENT, Endocrine, Cardiovascular, Psychiatric, Allergic/Imm, Heme/Lymph   Last edited by [name], COT on 12/09/2018 10:03 AM. (History)       ALLERGIES No Known Allergies  PAST MEDICAL HISTORY Past Medical History:  Diagnosis Date  . Developmental delay   . Diabetes mellitus without complication ([place])   . Seizures ([place])    History reviewed. No pertinent surgical history.  FAMILY HISTORY History reviewed. No pertinent family history.  SOCIAL HISTORY Social History   Tobacco Use  . Smoking status: Never Smoker  . Smokeless tobacco: Never Used  Substance Use Topics  . Alcohol use: No    Alcohol/week: 0.0 standard drinks  . Drug use: Not on file         OPHTHALMIC EXAM:  Base Eye Exam    Visual Acuity (Snellen - Linear)      Right Left   Dist cc 20/80 -2 20/70  Dist ph cc NI 20/50   Correction: Glasses       Tonometry (Tonopen, 10:19 AM)      Right Left   Pressure 23 24  Difficult patient squeezing       Pupils      Dark Light Shape React APD   Right 3 2 Round Minimal None   Left 3 2 Round Minimal None       Visual Fields (Counting fingers)      Left Right    Full Full       Extraocular Movement      Right Left    Full, Ortho Full, Ortho       Neuro/Psych    Oriented x3: Yes   Mood/Affect: Normal       Dilation    Both eyes: 1.0% Mydriacyl, 2.5% Phenylephrine @ 10:19 AM        Slit Lamp and Fundus Exam    Slit Lamp Exam      Right Left    Lids/Lashes Dermatochalasis - upper lid, mild Meibomian gland dysfunction Dermatochalasis - upper lid, mild Meibomian gland dysfunction   Conjunctiva/Sclera White and quiet, mild melanosis White and quiet, mild melanosis   Cornea 2-3+ Punctate epithelial erosions trace Punctate epithelial erosions   Anterior Chamber Deep and quiet Deep and quiet   Iris Round and dilated Round and dilated   Lens 2-3+ Nuclear sclerosis with early brunescence, 2+ Cortical cataract 2-3+ Nuclear sclerosis with early brunescence, 2+ Cortical cataract   Vitreous Mild Vitreous syneresis Mild Vitreous syneresis       Fundus Exam      Right Left   Disc Pink and Sharp, Peripapillary atrophy Pink and Sharp   C/D Ratio 0.5 0.5   Macula Full thickness Macular hole with surrounding CME, no heme Flat, Good foveal reflex, Retinal pigment epithelial mottling, No heme or edema   Vessels Vascular attenuation, Copper wiring, AV crossing changes Vascular attenuation, Copper wiring, AV crossing changes   Periphery Attached, No heme  Attached, No heme         Refraction    Wearing Rx      Sphere Cylinder Axis Add   Right -0.25 +1.25 013 +2.25   Left -0.75 +1.00 170 +2.25   Type: Bifocal          IMAGING AND PROCEDURES  Imaging and Procedures for _0 @  OCT, Retina - OU - Both Eyes       Right Eye Quality was good. Central Foveal Thickness: 366. Progression has been stable. Findings include abnormal foveal contour, intraretinal fluid, macular hole, no SRF (Partial PVD).   Left Eye Quality was good. Central Foveal Thickness: 248. Progression has been stable. Findings include normal foveal contour, no IRF, no SRF, vitreomacular adhesion .   Notes *Images captured and stored on drive  Diagnosis / Impression:  OD: FTMH with surrounding IRF/CME OS: NFP, no IRF/SRF, VMA  Clinical management:  See below  Abbreviations: NFP - Normal foveal profile. CME - cystoid macular edema. PED - pigment epithelial  detachment. IRF - intraretinal fluid. SRF - subretinal fluid. EZ - ellipsoid zone. ERM - epiretinal membrane. ORA - outer retinal atrophy. ORT - outer retinal tubulation. SRHM - subretinal hyper-reflective material                 ASSESSMENT/PLAN:    ICD-10-CM   1. Macular hole of right eye  H35.341   2. Retinal edema  H35.81 OCT, Retina - OU - Both Eyes  3. Essential hypertension  I10   4. Hypertensive retinopathy of both eyes  H35.033   5. Combined forms of age-related cataract of both eyes  H25.813     1,2. Full thickness mac hole OD - The natural history, anatomy, potential for loss of vision, and treatment options including vitrectomy techniques and the complications of endophthalmitis, retinal detachment, vitreous hemorrhage, cataract progression and permanent vision loss discussed with the patient. - recommend 25g PPV+ICG/MP/Gas OD under general anesthesia - RBA of procedure discussed w/ patient and caregiver and pt's MPOA (sister in [place], [place] -- spoke via telephone), questions answered - pt with mental retardation and lives in a group home of 6 residents and 3 care givers - discussed concerns about pt's ability to position post-operatively - spoke with MPOA (sister) and care taker -- they wish to proceed with surgery on October 15th - will need medical clearance from PCP and pre-op COVID testing - f/u 12/30/18 for POV1  3,4. Hypertensive retinopathy OU  - discussed importance of tight BP control  - monitor  5. Mixed form age related cataract OU  - The symptoms of cataract, surgical options, and treatments and risks were discussed with patient.  - discussed diagnosis and progression  - not yet visually significant  - monitor for now   Ophthalmic Meds Ordered this visit:  No orders of the defined types were placed in this encounter.      Return in about 3 weeks (around 12/30/2018) for POV.  There are no Patient Instructions on file for this  visit.   Explained the diagnoses, plan, and follow up with the patient and they expressed understanding.  Patient expressed understanding of the importance of proper follow up care.   This document serves as a record of services personally performed by Gardiner Sleeper, MD, PhD. It was created on their behalf by Ernest Mallick, OA, an ophthalmic assistant. The creation of this record is the provider's dictation and/or activities during the visit.    Electronically signed by: Ernest Mallick, OA  09.25.2020 4:31 PM     Gardiner Sleeper, M.D., Ph.D. Diseases & Surgery of the Retina and Vitreous [facility]  I have reviewed the above documentation for accuracy and completeness, and I agree with the above. Gardiner Sleeper, M.D., Ph.D. 12/10/18 4:31 PM    Abbreviations: M myopia (nearsighted); A astigmatism; H hyperopia (farsighted); P presbyopia; Mrx spectacle prescription;  CTL contact lenses; OD right eye; OS left eye; OU both eyes  XT exotropia; ET esotropia; PEK punctate epithelial keratitis; PEE punctate epithelial erosions; DES dry eye syndrome; MGD meibomian gland dysfunction; ATs artificial tears; PFAT's preservative free artificial tears; [place] nuclear sclerotic cataract; PSC posterior subcapsular cataract; ERM epi-retinal membrane; PVD posterior vitreous detachment; RD retinal detachment; DM diabetes mellitus; DR diabetic retinopathy; NPDR non-proliferative diabetic retinopathy; PDR proliferative diabetic retinopathy; CSME clinically significant macular edema; DME diabetic macular edema; dbh dot blot hemorrhages; CWS cotton wool spot; POAG primary open angle glaucoma; C/D cup-to-disc ratio; HVF humphrey visual field; GVF goldmann visual field; OCT optical coherence tomography; IOP intraocular pressure; BRVO Branch retinal vein occlusion; CRVO central retinal vein occlusion; CRAO central retinal artery occlusion; BRAO branch retinal artery occlusion; RT retinal tear; SB scleral  buckle; PPV pars plana vitrectomy; VH Vitreous hemorrhage; PRP panretinal laser photocoagulation; IVK intravitreal kenalog; VMT vitreomacular traction; MH Macular hole;  NVD neovascularization of the disc; NVE neovascularization elsewhere; AREDS age related eye disease study; ARMD age related macular degeneration; POAG primary  open angle glaucoma; EBMD epithelial/anterior basement membrane dystrophy; ACIOL anterior chamber intraocular lens; IOL intraocular lens; PCIOL posterior chamber intraocular lens; Phaco/IOL phacoemulsification with intraocular lens placement; PRK photorefractive keratectomy; LASIK laser assisted in situ keratomileusis; HTN hypertension; DM diabetes mellitus; COPD chronic obstructive pulmonary disease  

## 2018-12-20 ENCOUNTER — Encounter (INDEPENDENT_AMBULATORY_CARE_PROVIDER_SITE_OTHER): Payer: Medicare HMO | Admitting: Ophthalmology

## 2018-12-26 ENCOUNTER — Other Ambulatory Visit (HOSPITAL_COMMUNITY)
Admission: RE | Admit: 2018-12-26 | Discharge: 2018-12-26 | Disposition: A | Discharge status: Home / Self Care | Payer: Medicare HMO | Source: Ambulatory Visit | Attending: Ophthalmology | Admitting: Ophthalmology

## 2018-12-26 ENCOUNTER — Encounter (HOSPITAL_COMMUNITY): Payer: Self-pay

## 2018-12-26 ENCOUNTER — Other Ambulatory Visit: Payer: Self-pay

## 2018-12-26 DIAGNOSIS — Z20828 Contact with and (suspected) exposure to other viral communicable diseases: Secondary | ICD-10-CM | POA: Insufficient documentation

## 2018-12-26 DIAGNOSIS — Z01812 Encounter for preprocedural laboratory examination: Secondary | ICD-10-CM | POA: Insufficient documentation

## 2018-12-26 LAB — SARS CORONAVIRUS 2 (TAT 6-24 HRS): SARS Coronavirus 2: NEGATIVE

## 2018-12-26 NOTE — H&P (Signed)
GRENVILLE MATISON is an 63 y.o. male.    Chief Complaint: decreased vision, macular hole, RIGHT EYE  HPI: Pt is a 63 yo M with developmental delay, MR, living in group home, referred by Dr. Parke Simmers for macular hole, right eye. On dilated exam and OCT, full thickness macular hole was confirmed OD. After lengthy discussion with caretaker, and pt's sister in [place] of the risks, benefits and alternatives to surgery, the medical power of attorney, Ms. Lew Dawes, elects to proceed with surgery for macular hole repair OD for Mr. Higinbotham. Informed consent was obtained over the telephone by 2 independent verifiers.  Past Medical History:  Diagnosis Date  . Developmental delay   . Diabetes mellitus without complication ([place])   . Seizures ([place])     No past surgical history on file.  No family history on file. Social History:  reports that he has never smoked. He has never used smokeless tobacco. He reports that he does not drink alcohol. No history on file for drug.  Allergies: No Known Allergies  No medications prior to admission.    Review of systems otherwise negative  There were no vitals taken for this visit.  Physical exam: Mental status: oriented x3. Eyes: See eye exam associated with this date of surgery Ears, Nose, Throat: within normal limits Neck: Within Normal limits General: within normal limits Chest: Within normal limits Breast: deferred Heart: Within normal limits Abdomen: Within normal limits GU: deferred Extremities: within normal limits Skin: within normal limits  Assessment/Plan 1. Full thickness macular hole, RIGHT EYE  Plan: To [facility] for 25g PPV w/ ICG/ILM peel/gas, OD under general anesthesia - Case scheduled for Thursday 10.15.2020, 1130 am -- [facility] OR 08  Gardiner Sleeper, M.D., Ph.D. Vitreoretinal Surgeon [facility]

## 2018-12-26 NOTE — Progress Notes (Signed)
[facility] Note  12/30/2018     CHIEF COMPLAINT Patient presents for Post-op Follow-up   HISTORY OF PRESENT ILLNESS: Roy Young is a 63 y.o. male who presents to the clinic today for:   HPI    Post-op Follow-up    In right eye.  Discomfort includes Negative for pain, itching, foreign body sensation, tearing, discharge, floaters and none.  Vision is blurred at distance and is blurred at near.  I, the attending physician,  performed the HPI with the patient and updated documentation appropriately.          Comments    Pt's care provider states patient not complaining of eye pain/discomfort. Patient wearing patch and in head down position this am.        Last edited by [name], MD on 12/30/2018  8:12 AM. (History)    pt is with his caregiver today, pt unable to communicate how his vision is doing   Referring physician: [name], [name], [place],  [place] [id number]  HISTORICAL INFORMATION:   Selected notes from the [facility] Referred by Dr. [name] for concern of ERM with mac hole OD LEE: 09.01.20 (J. [name]) [BCVA: OD: 20/60--, OS: 20/0--] Ocular Hx- PMH-developmental delay, DM, seizures   CURRENT MEDICATIONS: No current outpatient medications on file. (Ophthalmic Drugs)   No current facility-administered medications for this visit.  (Ophthalmic Drugs)   Current Outpatient Medications (Other)  Medication Sig  . carbamazepine (CARBATROL) 300 MG 12 hr capsule Take 300-600 mg by mouth See admin instructions. Take 300 mg in the morning and 600 mg in the evening  . divalproex (DEPAKOTE ER) 250 MG 24 hr tablet Take 250-750 mg by mouth See admin instructions. Take 500 mg in the morning, 750 mg Mid-day and in the evening  . ergocalciferol (VITAMIN D2) 50000 units capsule Take 50,000 Units by mouth once a week.  . Lacosamide (VIMPAT) 100 MG TABS Take 100 mg by mouth 2 (two) times daily.  Marland Kitchen  levETIRAcetam (KEPPRA) 500 MG tablet Take 500 mg by mouth 2 (two) times daily.  Marland Kitchen loratadine (CLARITIN) 10 MG tablet Take 10 mg by mouth daily.  Marland Kitchen lovastatin (MEVACOR) 40 MG tablet Take 80 mg by mouth every evening.   . Multiple Vitamin (MULTI-VITAMIN) tablet Take 1 tablet by mouth daily. Chewable  . Multiple Vitamins-Minerals (MULTIVITAMIN ADULT PO) Take by mouth.  . propranolol (INDERAL) 10 MG tablet Take 10 mg by mouth 2 (two) times daily.   . sertraline (ZOLOFT) 100 MG tablet Take 100 mg by mouth at bedtime.    No current facility-administered medications for this visit.  (Other)      REVIEW OF SYSTEMS: ROS    Positive for: Neurological, Eyes, Respiratory   Negative for: Constitutional, Gastrointestinal, Skin, Genitourinary, Musculoskeletal, HENT, Endocrine, Cardiovascular, Psychiatric, Allergic/Imm, Heme/Lymph   Last edited by [name], COT on 12/30/2018  8:04 AM. (History)       ALLERGIES No Known Allergies  PAST MEDICAL HISTORY Past Medical History:  Diagnosis Date  . Developmental delay   . Diabetes mellitus without complication ([place])    not on medication; diet-controlled  . Seizures ([place])   . Sleep apnea    Past Surgical History:  Procedure Laterality Date  . [place] VITRECTOMY WITH 20 GAUGE MVR PORT FOR MACULAR HOLE Right 12/29/2018   Procedure: 25 GAUGE PARS PLANA VITRECTOMY;  Surgeon: [name], MD;  Location: [facility];  Service:  Ophthalmology;  Laterality: Right;    FAMILY HISTORY History reviewed. No pertinent family history.  SOCIAL HISTORY Social History   Tobacco Use  . Smoking status: Never Smoker  . Smokeless tobacco: Never Used  Substance Use Topics  . Alcohol use: No    Alcohol/week: 0.0 standard drinks  . Drug use: Never         OPHTHALMIC EXAM:  Base Eye Exam    Visual Acuity (Snellen - Linear)      Right Left   Dist [place]  20/50 +2   Dist ph [place]  20/40 -2  Pt unresponsive to vision check OD       Tonometry  (Tonopen, 8:09 AM)      Right Left   Pressure 12        Pupils      Dark Light Shape React APD   Right unable       Left 3 2 Round Slow None  Patient will not open OD       Neuro/Psych    Oriented x3: Yes   Mood/Affect: Normal       Dilation    Right eye: 1.0% Mydriacyl, 2.5% Phenylephrine @ 8:09 AM        Slit Lamp and Fundus Exam    Slit Lamp Exam      Right Left   Lids/Lashes Dermatochalasis - upper lid, mild Meibomian gland dysfunction Dermatochalasis - upper lid, mild Meibomian gland dysfunction   Conjunctiva/Sclera Mild Subconjunctival hemorrhage, sutures intact White and quiet, mild melanosis   Cornea 2-3+ Punctate epithelial erosions, central epi defect, 2+ Descemet's folds trace Punctate epithelial erosions   Anterior Chamber Deep and quiet Deep and quiet   Iris Round and dilated Round and dilated   Lens 2-3+ Nuclear sclerosis with early brunescence, 2+ Cortical cataract, 1-2+PC feathering 2-3+ Nuclear sclerosis with early brunescence, 2+ Cortical cataract   Vitreous post vitrectomy, good gas fill Mild Vitreous syneresis       Fundus Exam      Right Left   Disc Pink and Sharp, Peripapillary atrophy    C/D Ratio 0.5 0.5   Macula Flat, mac hole closing    Vessels Vascular attenuation, Copper wiring, AV crossing changes    Periphery Attached, No heme            IMAGING AND PROCEDURES  Imaging and Procedures for _0 @           ASSESSMENT/PLAN:    ICD-10-CM   1. Macular hole of right eye  H35.341   2. Retinal edema  H35.81   3. Essential hypertension  I10   4. Hypertensive retinopathy of both eyes  H35.033   5. Combined forms of age-related cataract of both eyes  H25.813     1,2. Full thickness mac hole OD  - pt with mental retardation and lives in a group home of 6 residents and 3 care givers  - discussed concerns about pt's ability to position post-operatively  - spoke with MPOA (sister) and care taker   - now POD1 s/p PPV/ICG/MP/14% C3F8  OS, 10.15.20             - doing well this morning             - retina attached and good gas bubble in place w/ mac hole closing             - IOP 12             - cont  PF 4x/day OD                          zymaxid QID OD                          Atropine BID OD   Brimonidine BID OD                         PSO ung QID OD             - cont face down positioning x3 days then can decrease positioning to 50% of time; avoid laying flat on back              - eye shield when sleeping              - post op drop and positioning instructions reviewed              - tylenol/ibuprofen for pain  - f/u Thursday, October 22  3,4. Hypertensive retinopathy OU  - discussed importance of tight BP control  - monitor  5. Mixed form age related cataract OU  - The symptoms of cataract, surgical options, and treatments and risks were discussed with patient.  - discussed diagnosis and progression  - not yet visually significant  - monitor for now   Ophthalmic Meds Ordered this visit:  No orders of the defined types were placed in this encounter.      Return in about 1 week (around 01/06/2019) for POV.  There are no Patient Instructions on file for this visit.   Explained the diagnoses, plan, and follow up with the patient and they expressed understanding.  Patient expressed understanding of the importance of proper follow up care.   This document serves as a record of services personally performed by Gardiner Sleeper, MD, PhD. It was created on their behalf by Roselee Nova, COMT. The creation of this record is the provider's dictation and/or activities during the visit.  Electronically signed by: Roselee Nova, COMT 12/30/18 8:26 AM   Gardiner Sleeper, M.D., Ph.D. Diseases & Surgery of the Retina and Vitreous [facility] 12/30/18  I have reviewed the above documentation for accuracy and completeness, and I agree with the above. Gardiner Sleeper, M.D., Ph.D. 12/30/18  8:33 AM    Abbreviations: M myopia (nearsighted); A astigmatism; H hyperopia (farsighted); P presbyopia; Mrx spectacle prescription;  CTL contact lenses; OD right eye; OS left eye; OU both eyes  XT exotropia; ET esotropia; PEK punctate epithelial keratitis; PEE punctate epithelial erosions; DES dry eye syndrome; MGD meibomian gland dysfunction; ATs artificial tears; PFAT's preservative free artificial tears; [place] nuclear sclerotic cataract; PSC posterior subcapsular cataract; ERM epi-retinal membrane; PVD posterior vitreous detachment; RD retinal detachment; DM diabetes mellitus; DR diabetic retinopathy; NPDR non-proliferative diabetic retinopathy; PDR proliferative diabetic retinopathy; CSME clinically significant macular edema; DME diabetic macular edema; dbh dot blot hemorrhages; CWS cotton wool spot; POAG primary open angle glaucoma; C/D cup-to-disc ratio; HVF humphrey visual field; GVF goldmann visual field; OCT optical coherence tomography; IOP intraocular pressure; BRVO Branch retinal vein occlusion; CRVO central retinal vein occlusion; CRAO central retinal artery occlusion; BRAO branch retinal artery occlusion; RT retinal tear; SB scleral buckle; PPV pars plana vitrectomy; VH Vitreous hemorrhage; PRP panretinal laser photocoagulation; IVK intravitreal kenalog; VMT vitreomacular traction; MH Macular hole;  NVD neovascularization of the disc; NVE neovascularization  elsewhere; AREDS age related eye disease study; ARMD age related macular degeneration; POAG primary open angle glaucoma; EBMD epithelial/anterior basement membrane dystrophy; ACIOL anterior chamber intraocular lens; IOL intraocular lens; PCIOL posterior chamber intraocular lens; Phaco/IOL phacoemulsification with intraocular lens placement; [place] photorefractive keratectomy; LASIK laser assisted in situ keratomileusis; HTN hypertension; DM diabetes mellitus; COPD chronic obstructive pulmonary disease

## 2018-12-27 ENCOUNTER — Encounter (INDEPENDENT_AMBULATORY_CARE_PROVIDER_SITE_OTHER): Payer: Medicare HMO | Admitting: Ophthalmology

## 2018-12-29 ENCOUNTER — Ambulatory Visit (HOSPITAL_COMMUNITY): Payer: Medicare HMO | Admitting: Certified Registered Nurse Anesthetist

## 2018-12-29 ENCOUNTER — Other Ambulatory Visit: Payer: Self-pay

## 2018-12-29 ENCOUNTER — Encounter (HOSPITAL_COMMUNITY)
Admission: RE | Disposition: A | Discharge status: Home / Self Care | Payer: Self-pay | Source: Home / Self Care | Attending: Ophthalmology

## 2018-12-29 ENCOUNTER — Encounter (HOSPITAL_COMMUNITY): Payer: Self-pay

## 2018-12-29 ENCOUNTER — Ambulatory Visit (HOSPITAL_COMMUNITY)
Admission: RE | Admit: 2018-12-29 | Discharge: 2018-12-29 | Disposition: A | Discharge status: Home / Self Care | Payer: Medicare HMO | Attending: Ophthalmology | Admitting: Ophthalmology

## 2018-12-29 DIAGNOSIS — R569 Unspecified convulsions: Secondary | ICD-10-CM | POA: Insufficient documentation

## 2018-12-29 DIAGNOSIS — R6250 Unspecified lack of expected normal physiological development in childhood: Secondary | ICD-10-CM | POA: Insufficient documentation

## 2018-12-29 DIAGNOSIS — Z79899 Other long term (current) drug therapy: Secondary | ICD-10-CM | POA: Insufficient documentation

## 2018-12-29 DIAGNOSIS — G4733 Obstructive sleep apnea (adult) (pediatric): Secondary | ICD-10-CM | POA: Diagnosis not present

## 2018-12-29 DIAGNOSIS — E7800 Pure hypercholesterolemia, unspecified: Secondary | ICD-10-CM | POA: Diagnosis not present

## 2018-12-29 DIAGNOSIS — H35341 Macular cyst, hole, or pseudohole, right eye: Secondary | ICD-10-CM | POA: Insufficient documentation

## 2018-12-29 DIAGNOSIS — E119 Type 2 diabetes mellitus without complications: Secondary | ICD-10-CM | POA: Insufficient documentation

## 2018-12-29 DIAGNOSIS — G4730 Sleep apnea, unspecified: Secondary | ICD-10-CM | POA: Diagnosis not present

## 2018-12-29 HISTORY — DX: Sleep apnea, unspecified: G47.30

## 2018-12-29 HISTORY — PX: 25 GAUGE PARS PLANA VITRECTOMY WITH 20 GAUGE MVR PORT FOR MACULAR HOLE: SHX6096

## 2018-12-29 LAB — BASIC METABOLIC PANEL
Anion gap: 9 (ref 5–15)
BUN: 7 mg/dL — ABNORMAL LOW (ref 8–23)
CO2: 25 mmol/L (ref 22–32)
Calcium: 9.2 mg/dL (ref 8.9–10.3)
Chloride: 98 mmol/L (ref 98–111)
Creatinine, Ser: 0.83 mg/dL (ref 0.61–1.24)
GFR calc Af Amer: >60 mL/min (ref >60)
GFR calc non Af Amer: >60 mL/min (ref >60)
Glucose, Bld: 97 mg/dL (ref 70–99)
Potassium: 4.8 mmol/L (ref 3.5–5.1)
Sodium: 132 mmol/L — ABNORMAL LOW (ref 135–145)

## 2018-12-29 LAB — GLUCOSE, CAPILLARY
Glucose-Capillary: 102 mg/dL — ABNORMAL HIGH (ref 70–99)
Glucose-Capillary: 139 mg/dL — ABNORMAL HIGH (ref 70–99)

## 2018-12-29 LAB — HEMOGLOBIN: Hemoglobin: 14.7 g/dL (ref 13.0–17.0)

## 2018-12-29 SURGERY — 25 GAUGE PARS PLANA VITRECTOMY WITH 20 GAUGE MVR PORT FOR MACULAR HOLE
Anesthesia: General | Site: Eye | Laterality: Right

## 2018-12-29 MED ORDER — ATROPINE SULFATE 1 % OP SOLN
OPHTHALMIC | Status: AC
  Filled 2018-12-29: qty 5

## 2018-12-29 MED ORDER — PREDNISOLONE ACETATE 1 % OP SUSP
OPHTHALMIC | Status: AC
  Filled 2018-12-29: qty 5

## 2018-12-29 MED ORDER — LIDOCAINE 2% (20 MG/ML) 5 ML SYRINGE
INTRAMUSCULAR | Status: AC
  Filled 2018-12-29: qty 5

## 2018-12-29 MED ORDER — BSS IO SOLN
INTRAOCULAR | Status: AC
  Filled 2018-12-29: qty 15

## 2018-12-29 MED ORDER — BRIMONIDINE TARTRATE 0.2 % OP SOLN
OPHTHALMIC | Status: DC | PRN
  Administered 2018-12-29: 1 [drp] via OPHTHALMIC

## 2018-12-29 MED ORDER — LIDOCAINE 2% (20 MG/ML) 5 ML SYRINGE
INTRAMUSCULAR | Status: DC | PRN
  Administered 2018-12-29: 60 mg via INTRAVENOUS

## 2018-12-29 MED ORDER — PHENYLEPHRINE HCL (PRESSORS) 10 MG/ML IV SOLN
INTRAVENOUS | Status: AC
  Filled 2018-12-29: qty 1

## 2018-12-29 MED ORDER — GATIFLOXACIN 0.5 % OP SOLN
OPHTHALMIC | Status: AC
  Filled 2018-12-29: qty 2.5

## 2018-12-29 MED ORDER — PROPOFOL 10 MG/ML IV BOLUS
INTRAVENOUS | Status: AC
  Filled 2018-12-29: qty 20

## 2018-12-29 MED ORDER — EPHEDRINE 5 MG/ML INJ
INTRAVENOUS | Status: AC
  Filled 2018-12-29: qty 10

## 2018-12-29 MED ORDER — GATIFLOXACIN 0.5 % OP SOLN OPTIME - NO CHARGE
OPHTHALMIC | Status: DC | PRN
  Administered 2018-12-29: 1 [drp] via OPHTHALMIC

## 2018-12-29 MED ORDER — TRIAMCINOLONE ACETONIDE 40 MG/ML IJ SUSP
INTRAMUSCULAR | Status: DC | PRN
  Administered 2018-12-29: 40 mg

## 2018-12-29 MED ORDER — PHENYLEPHRINE HCL 10 % OP SOLN
1.0000 [drp] | OPHTHALMIC | Status: AC | PRN
  Administered 2018-12-29 (×3): 1 [drp] via OPHTHALMIC
  Filled 2018-12-29: qty 5

## 2018-12-29 MED ORDER — PHENYLEPHRINE 40 MCG/ML (10ML) SYRINGE FOR IV PUSH (FOR BLOOD PRESSURE SUPPORT)
PREFILLED_SYRINGE | INTRAVENOUS | Status: AC
  Filled 2018-12-29: qty 10

## 2018-12-29 MED ORDER — DEXAMETHASONE SODIUM PHOSPHATE 10 MG/ML IJ SOLN
INTRAMUSCULAR | Status: AC
  Filled 2018-12-29: qty 1

## 2018-12-29 MED ORDER — EPINEPHRINE PF 1 MG/ML IJ SOLN
INTRAMUSCULAR | Status: AC
  Filled 2018-12-29: qty 1

## 2018-12-29 MED ORDER — STERILE WATER FOR INJECTION IJ SOLN
INTRAMUSCULAR | Status: AC
  Filled 2018-12-29: qty 10

## 2018-12-29 MED ORDER — SUCCINYLCHOLINE CHLORIDE 200 MG/10ML IV SOSY
PREFILLED_SYRINGE | INTRAVENOUS | Status: AC
  Filled 2018-12-29: qty 10

## 2018-12-29 MED ORDER — FENTANYL CITRATE (PF) 250 MCG/5ML IJ SOLN
INTRAMUSCULAR | Status: DC | PRN
  Administered 2018-12-29: 100 ug via INTRAVENOUS

## 2018-12-29 MED ORDER — 0.9 % SODIUM CHLORIDE (POUR BTL) OPTIME
TOPICAL | Status: DC | PRN
  Administered 2018-12-29: 1000 mL

## 2018-12-29 MED ORDER — BRIMONIDINE TARTRATE 0.2 % OP SOLN
OPHTHALMIC | Status: AC
  Filled 2018-12-29: qty 5

## 2018-12-29 MED ORDER — PROPARACAINE HCL 0.5 % OP SOLN
1.0000 [drp] | OPHTHALMIC | Status: AC | PRN
  Administered 2018-12-29 (×3): 1 [drp] via OPHTHALMIC
  Filled 2018-12-29: qty 15

## 2018-12-29 MED ORDER — BUPIVACAINE HCL (PF) 0.75 % IJ SOLN
INTRAMUSCULAR | Status: AC
  Filled 2018-12-29: qty 10

## 2018-12-29 MED ORDER — STERILE WATER FOR INJECTION IJ SOLN
INTRAMUSCULAR | Status: DC | PRN
  Administered 2018-12-29: 50 mL

## 2018-12-29 MED ORDER — DORZOLAMIDE HCL-TIMOLOL MAL 2-0.5 % OP SOLN
OPHTHALMIC | Status: AC
  Filled 2018-12-29: qty 10

## 2018-12-29 MED ORDER — POLYMYXIN B SULFATE 500000 UNITS IJ SOLR
INTRAMUSCULAR | Status: AC
  Filled 2018-12-29: qty 500000

## 2018-12-29 MED ORDER — STERILE WATER FOR INJECTION IJ SOLN
INTRAMUSCULAR | Status: DC | PRN
  Administered 2018-12-29: 20 mL

## 2018-12-29 MED ORDER — DEXAMETHASONE SODIUM PHOSPHATE 10 MG/ML IJ SOLN
INTRAMUSCULAR | Status: DC | PRN
  Administered 2018-12-29: 5 mg via INTRAVENOUS

## 2018-12-29 MED ORDER — LIDOCAINE HCL (PF) 2 % IJ SOLN
INTRAMUSCULAR | Status: AC
  Filled 2018-12-29: qty 10

## 2018-12-29 MED ORDER — CEFTAZIDIME 1 G IJ SOLR
INTRAMUSCULAR | Status: AC
  Filled 2018-12-29: qty 1

## 2018-12-29 MED ORDER — BACITRACIN-POLYMYXIN B 500-10000 UNIT/GM OP OINT
TOPICAL_OINTMENT | OPHTHALMIC | Status: DC | PRN
  Administered 2018-12-29: 1 via OPHTHALMIC

## 2018-12-29 MED ORDER — CARBACHOL 0.01 % IO SOLN
INTRAOCULAR | Status: AC
  Filled 2018-12-29: qty 1.5

## 2018-12-29 MED ORDER — ACETAZOLAMIDE SODIUM 500 MG IJ SOLR
INTRAMUSCULAR | Status: AC
  Filled 2018-12-29: qty 500

## 2018-12-29 MED ORDER — PREDNISOLONE ACETATE 1 % OP SUSP
OPHTHALMIC | Status: DC | PRN
  Administered 2018-12-29: 2 [drp] via OPHTHALMIC

## 2018-12-29 MED ORDER — SODIUM CHLORIDE 0.9 % IV SOLN
INTRAVENOUS | Status: DC | PRN
  Administered 2018-12-29: 25 ug/min via INTRAVENOUS

## 2018-12-29 MED ORDER — BACITRACIN-POLYMYXIN B 500-10000 UNIT/GM OP OINT
TOPICAL_OINTMENT | OPHTHALMIC | Status: AC
  Filled 2018-12-29: qty 3.5

## 2018-12-29 MED ORDER — FENTANYL CITRATE (PF) 250 MCG/5ML IJ SOLN
INTRAMUSCULAR | Status: AC
  Filled 2018-12-29: qty 5

## 2018-12-29 MED ORDER — ACETAMINOPHEN 500 MG PO TABS
1000.0000 mg | ORAL_TABLET | Freq: Once | ORAL | Status: AC
  Administered 2018-12-29: 1000 mg via ORAL
  Filled 2018-12-29: qty 2

## 2018-12-29 MED ORDER — MIDAZOLAM HCL 2 MG/2ML IJ SOLN
INTRAMUSCULAR | Status: AC
  Filled 2018-12-29: qty 2

## 2018-12-29 MED ORDER — PHENYLEPHRINE 40 MCG/ML (10ML) SYRINGE FOR IV PUSH (FOR BLOOD PRESSURE SUPPORT)
PREFILLED_SYRINGE | INTRAVENOUS | Status: DC | PRN
  Administered 2018-12-29 (×2): 80 ug via INTRAVENOUS

## 2018-12-29 MED ORDER — SODIUM CHLORIDE 0.9 % IV SOLN
INTRAVENOUS | Status: DC
  Administered 2018-12-29 (×2): via INTRAVENOUS

## 2018-12-29 MED ORDER — TOBRAMYCIN-DEXAMETHASONE 0.3-0.1 % OP OINT
TOPICAL_OINTMENT | OPHTHALMIC | Status: AC
  Filled 2018-12-29: qty 3.5

## 2018-12-29 MED ORDER — ATROPINE SULFATE 1 % OP SOLN
1.0000 [drp] | OPHTHALMIC | Status: AC | PRN
  Administered 2018-12-29 (×3): 1 [drp] via OPHTHALMIC
  Filled 2018-12-29: qty 5

## 2018-12-29 MED ORDER — NA CHONDROIT SULF-NA HYALURON 40-30 MG/ML IO SOLN
INTRAOCULAR | Status: AC
  Filled 2018-12-29: qty 1

## 2018-12-29 MED ORDER — DORZOLAMIDE HCL-TIMOLOL MAL 2-0.5 % OP SOLN
OPHTHALMIC | Status: DC | PRN
  Administered 2018-12-29: 1 [drp] via OPHTHALMIC

## 2018-12-29 MED ORDER — SODIUM CHLORIDE (PF) 0.9 % IJ SOLN
INTRAMUSCULAR | Status: AC
  Filled 2018-12-29: qty 10

## 2018-12-29 MED ORDER — PROPOFOL 10 MG/ML IV BOLUS
INTRAVENOUS | Status: DC | PRN
  Administered 2018-12-29: 200 mg via INTRAVENOUS

## 2018-12-29 MED ORDER — TRIAMCINOLONE ACETONIDE 40 MG/ML IJ SUSP
INTRAMUSCULAR | Status: AC
  Filled 2018-12-29: qty 5

## 2018-12-29 MED ORDER — INDOCYANINE GREEN 25 MG IV SOLR
INTRAVENOUS | Status: DC | PRN
  Administered 2018-12-29: 25 mg via OPHTHALMIC

## 2018-12-29 MED ORDER — BSS PLUS IO SOLN
INTRAOCULAR | Status: AC
  Filled 2018-12-29: qty 500

## 2018-12-29 MED ORDER — ROCURONIUM BROMIDE 10 MG/ML (PF) SYRINGE
PREFILLED_SYRINGE | INTRAVENOUS | Status: DC | PRN
  Administered 2018-12-29: 50 mg via INTRAVENOUS

## 2018-12-29 MED ORDER — TROPICAMIDE 1 % OP SOLN
1.0000 [drp] | OPHTHALMIC | Status: AC | PRN
  Administered 2018-12-29 (×3): 1 [drp] via OPHTHALMIC
  Filled 2018-12-29: qty 15

## 2018-12-29 MED ORDER — ATROPINE SULFATE 1 % OP SOLN
OPHTHALMIC | Status: DC | PRN
  Administered 2018-12-29: 2 [drp] via OPHTHALMIC

## 2018-12-29 MED ORDER — ONDANSETRON HCL 4 MG/2ML IJ SOLN
INTRAMUSCULAR | Status: AC
  Filled 2018-12-29: qty 2

## 2018-12-29 MED ORDER — DEXTROSE 5 % IV SOLN
INTRAVENOUS | Status: AC
  Filled 2018-12-29: qty 100

## 2018-12-29 MED ORDER — SUGAMMADEX SODIUM 200 MG/2ML IV SOLN
INTRAVENOUS | Status: DC | PRN
  Administered 2018-12-29: 200 mg via INTRAVENOUS

## 2018-12-29 MED ORDER — NA CHONDROIT SULF-NA HYALURON 40-30 MG/ML IO SOLN
INTRAOCULAR | Status: DC | PRN
  Administered 2018-12-29: 0.5 mL via INTRAOCULAR

## 2018-12-29 MED ORDER — LIDOCAINE HCL (PF) 4 % IJ SOLN
INTRAMUSCULAR | Status: AC
  Filled 2018-12-29: qty 5

## 2018-12-29 MED ORDER — EPHEDRINE SULFATE-NACL 50-0.9 MG/10ML-% IV SOSY
PREFILLED_SYRINGE | INTRAVENOUS | Status: DC | PRN
  Administered 2018-12-29: 10 mg via INTRAVENOUS

## 2018-12-29 MED ORDER — SUCCINYLCHOLINE CHLORIDE 20 MG/ML IJ SOLN
INTRAMUSCULAR | Status: DC | PRN
  Administered 2018-12-29: 100 mg via INTRAVENOUS

## 2018-12-29 MED ORDER — BSS PLUS IO SOLN
INTRAOCULAR | Status: DC | PRN
  Administered 2018-12-29: 1 via INTRAOCULAR

## 2018-12-29 MED ORDER — INDOCYANINE GREEN 25 MG IV SOLR
INTRAVENOUS | Status: AC
  Filled 2018-12-29: qty 25

## 2018-12-29 MED ORDER — ONDANSETRON HCL 4 MG/2ML IJ SOLN
INTRAMUSCULAR | Status: DC | PRN
  Administered 2018-12-29: 4 mg via INTRAVENOUS

## 2018-12-29 SURGICAL SUPPLY — 71 items
APPLICATOR COTTON TIP 6 STRL (MISCELLANEOUS) ×4 IMPLANT
APPLICATOR COTTON TIP 6IN STRL (MISCELLANEOUS) ×12
BAND WRIST GAS GREEN (MISCELLANEOUS) IMPLANT
BLADE EYE CATARACT 19 1.4 BEAV (BLADE) IMPLANT
BNDG EYE OVAL (GAUZE/BANDAGES/DRESSINGS) ×3 IMPLANT
CABLE BIPOLOR RESECTION CORD (MISCELLANEOUS) ×3 IMPLANT
CANNULA ANT CHAM MAIN (OPHTHALMIC RELATED) IMPLANT
CANNULA FLEX TIP 25G (CANNULA) ×6 IMPLANT
CANNULA TROCAR 23 GA VLV (OPHTHALMIC) IMPLANT
CANNULA VLV SOFT TIP 25GA (OPHTHALMIC) IMPLANT
CLOSURE STERI-STRIP 1/2X4 (GAUZE/BANDAGES/DRESSINGS) ×1
CLSR STERI-STRIP ANTIMIC 1/2X4 (GAUZE/BANDAGES/DRESSINGS) ×2 IMPLANT
COTTONBALL LRG STERILE PKG (GAUZE/BANDAGES/DRESSINGS) ×9 IMPLANT
COVER WAND RF STERILE (DRAPES) ×3 IMPLANT
DRAPE MICROSCOPE LEICA 46X105 (MISCELLANEOUS) ×3 IMPLANT
DRAPE OPHTHALMIC 77X100 STRL (CUSTOM PROCEDURE TRAY) ×3 IMPLANT
ERASER HMR WETFIELD 23G BP (MISCELLANEOUS) IMPLANT
FILTER BLUE MILLIPORE (MISCELLANEOUS) IMPLANT
FILTER STRAW FLUID ASPIR (MISCELLANEOUS) IMPLANT
FORCEPS GRIESHABER ILM 25G A (INSTRUMENTS) IMPLANT
FORCEPS GRIESHABER MAX 25G (MISCELLANEOUS) ×3 IMPLANT
GAS AUTO FILL CONSTEL (OPHTHALMIC) ×3
GAS AUTO FILL CONSTELLATION (OPHTHALMIC) ×1 IMPLANT
GAS WRIST BAND GREEN (MISCELLANEOUS)
GLOVE BIO SURGEON STRL SZ7.5 (GLOVE) ×6 IMPLANT
GLOVE BIOGEL M 7.0 STRL (GLOVE) ×3 IMPLANT
GOWN STRL REUS W/ TWL LRG LVL3 (GOWN DISPOSABLE) ×2 IMPLANT
GOWN STRL REUS W/ TWL XL LVL3 (GOWN DISPOSABLE) ×1 IMPLANT
GOWN STRL REUS W/TWL LRG LVL3 (GOWN DISPOSABLE) ×4
GOWN STRL REUS W/TWL XL LVL3 (GOWN DISPOSABLE) ×2
HANDLE PNEUMATIC FOR CONSTEL (OPHTHALMIC) ×3 IMPLANT
KIT BASIN OR (CUSTOM PROCEDURE TRAY) ×3 IMPLANT
KIT PERFLUORON PROCEDURE 5ML (MISCELLANEOUS) IMPLANT
LENS MACULAR ASPHERIC CONSTEL (OPHTHALMIC) IMPLANT
LENS VITRECTOMY FLAT OCLR DISP (MISCELLANEOUS) IMPLANT
LOOP FINESSE 25 GA (MISCELLANEOUS) IMPLANT
MICROPICK 25G (MISCELLANEOUS)
NEEDLE 18GX1X1/2 (RX/OR ONLY) (NEEDLE) ×3 IMPLANT
NEEDLE 25GX 5/8IN NON SAFETY (NEEDLE) ×9 IMPLANT
NEEDLE FILTER BLUNT 18X 1/2SAF (NEEDLE) ×4
NEEDLE FILTER BLUNT 18X1 1/2 (NEEDLE) ×2 IMPLANT
NEEDLE HYPO 18GX1.5 BLUNT FILL (NEEDLE) ×6 IMPLANT
NEEDLE HYPO 30X.5 LL (NEEDLE) ×6 IMPLANT
NEEDLE PRECISIONGLIDE 27X1.5 (NEEDLE) IMPLANT
NS IRRIG 1000ML POUR BTL (IV SOLUTION) ×3 IMPLANT
PACK VITRECTOMY CUSTOM (CUSTOM PROCEDURE TRAY) ×3 IMPLANT
PAD ARMBOARD 7.5X6 YLW CONV (MISCELLANEOUS) ×6 IMPLANT
PAK PIK VITRECTOMY CVS 25GA (OPHTHALMIC) ×3 IMPLANT
PENCIL BIPOLAR 25GA STR DISP (OPHTHALMIC RELATED) ×3 IMPLANT
PICK MICROPICK 25G (MISCELLANEOUS) IMPLANT
PROBE DIATHERMY DSP 27GA (MISCELLANEOUS) IMPLANT
PROBE ENDO DIATHERMY 25G (MISCELLANEOUS) IMPLANT
PROBE LASER ILLUM FLEX CVD 25G (OPHTHALMIC) ×3 IMPLANT
REPL STRA BRUSH NEEDLE (NEEDLE) IMPLANT
RESERVOIR BACK FLUSH (MISCELLANEOUS) IMPLANT
RETRACTOR IRIS FLEX 25G GRIESH (INSTRUMENTS) IMPLANT
SET INJECTOR OIL FLUID CONSTEL (OPHTHALMIC) IMPLANT
SHIELD EYE LENSE ONLY DISP (GAUZE/BANDAGES/DRESSINGS) ×3 IMPLANT
SPONGE SURGIFOAM ABS GEL 12-7 (HEMOSTASIS) IMPLANT
STOPCOCK 4 WAY LG BORE MALE ST (IV SETS) IMPLANT
SUT VICRYL 7 0 TG140 8 (SUTURE) ×3 IMPLANT
SYR 10ML LL (SYRINGE) ×3 IMPLANT
SYR 20ML LL LF (SYRINGE) ×3 IMPLANT
SYR 30ML LL (SYRINGE) ×6 IMPLANT
SYR 5ML LL (SYRINGE) ×6 IMPLANT
SYR BULB 3OZ (MISCELLANEOUS) ×3 IMPLANT
SYR TB 1ML LUER SLIP (SYRINGE) ×9 IMPLANT
TAPE CLOTH 1X10 TAN NS (GAUZE/BANDAGES/DRESSINGS) ×3 IMPLANT
TOWEL GREEN STERILE FF (TOWEL DISPOSABLE) ×3 IMPLANT
TUBING HIGH PRESS EXTEN 6IN (TUBING) ×3 IMPLANT
WATER STERILE IRR 1000ML POUR (IV SOLUTION) ×3 IMPLANT

## 2018-12-29 NOTE — Transfer of Care (Signed)
Immediate Anesthesia Transfer of Care Note  Patient: Roy Young  Procedure(s) Performed: 25 GAUGE PARS PLANA VITRECTOMY (Right Eye)  Patient Location: PACU  Anesthesia Type:General  Level of Consciousness: awake, alert  and oriented  Airway & Oxygen Therapy: Patient Spontanous Breathing and Patient connected to face mask oxygen  Post-op Assessment: Report given to RN and Post -op Vital signs reviewed and stable  Post vital signs: Reviewed and stable  Last Vitals:  Vitals Value Taken Time  BP 152/90 12/29/18 1404  Temp 36.7 C 12/29/18 1404  Pulse 72 12/29/18 1409  Resp 14 12/29/18 1409  SpO2 99 % 12/29/18 1409  Vitals shown include unvalidated device data.  Last Pain:  Vitals:   12/29/18 1003  PainSc: 0-No pain      Patients Stated Pain Goal: 3 ([id number])  Complications: No apparent anesthesia complications

## 2018-12-29 NOTE — Interval H&P Note (Signed)
History and Physical Interval Note:  12/29/2018 11:27 AM  Roy Young  has presented today for surgery, with the diagnosis of right eye, macular hole.  The various methods of treatment have been discussed with the patient and family. After consideration of risks, benefits and other options for treatment, the patient has consented to  Procedure(s): 25 GAUGE PARS PLANA VITRECTOMY (Right) as a surgical intervention.  The patient's history has been reviewed, patient examined, no change in status, stable for surgery.  I have reviewed the patient's chart and labs.  Questions were answered to the patient's satisfaction.     [name]

## 2018-12-29 NOTE — Anesthesia Preprocedure Evaluation (Addendum)
Anesthesia Evaluation  Patient identified by MRN, date of birth, ID band Patient awake    Reviewed: Allergy & Precautions, H&P , NPO status , Patient's Chart, lab work & pertinent test results  Airway Mallampati: II  TM Distance: >3 FB Neck ROM: Full    Dental no notable dental hx. (+) Partial Upper, Dental Advisory Given   Pulmonary sleep apnea ,    Pulmonary exam normal breath sounds clear to auscultation       Cardiovascular negative cardio ROS   Rhythm:Regular Rate:Normal     Neuro/Psych Seizures -, Well Controlled,  negative psych ROS   GI/Hepatic negative GI ROS, Neg liver ROS,   Endo/Other  diabetes, Well Controlled  Renal/GU negative Renal ROS  negative genitourinary   Musculoskeletal   Abdominal   Peds  Hematology negative hematology ROS (+)   Anesthesia Other Findings   Reproductive/Obstetrics negative OB ROS                            Anesthesia Physical Anesthesia Plan  ASA: III  Anesthesia Plan: General   Post-op Pain Management:    Induction: Intravenous  PONV Risk Score and Plan: 3 and Ondansetron, Dexamethasone and Midazolam  Airway Management Planned: Oral ETT  Additional Equipment:   Intra-op Plan:   Post-operative Plan: Extubation in OR  Informed Consent: I have reviewed the patients History and Physical, chart, labs and discussed the procedure including the risks, benefits and alternatives for the proposed anesthesia with the patient or authorized representative who has indicated his/her understanding and acceptance.     Dental advisory given  Plan Discussed with: CRNA  Anesthesia Plan Comments:         Anesthesia Quick Evaluation

## 2018-12-29 NOTE — Discharge Instructions (Addendum)
POSTOPERATIVE INSTRUCTIONS  Your doctor has performed vitreoretinal surgery on you at [facility]. [place] eye patched and shielded until seen by Dr. Coralyn Pear 8 AM tomorrow in clinic - Do not use drops until return - [place] - Sleep with belly down or on left side with face towards the ground, avoid laying flat on back.    - No strenuous bending, stooping or lifting.  - You may not drive until further notice.  - If your doctor used a gas bubble in your eye during the procedure he will advise you on postoperative positioning. If you have a gas bubble you will be wearing a green bracelet that was applied in the operating room. The green bracelet should stay on as long as the gas bubble is in your eye. While the gas bubble is present you should not fly in an airplane. If you require general anesthesia while the gas bubble is present you must notify your anesthesiologist that an intraocular gas bubble is present so he can take the appropriate precautions.  - Tylenol or any other over-the-counter pain reliever can be used according to your doctor. If more pain medicine is required, your doctor will have a prescription for you.  - You may read, go up and down stairs, and watch television.     [name], M.D., Ph.D.

## 2018-12-29 NOTE — Anesthesia Postprocedure Evaluation (Signed)
Anesthesia Post Note  Patient: Roy Young  Procedure(s) Performed: 25 GAUGE PARS PLANA VITRECTOMY (Right Eye)     Patient location during evaluation: PACU Anesthesia Type: General Level of consciousness: awake and alert Pain management: pain level controlled Vital Signs Assessment: post-procedure vital signs reviewed and stable Respiratory status: spontaneous breathing, nonlabored ventilation and respiratory function stable Cardiovascular status: blood pressure returned to baseline and stable Postop Assessment: no apparent nausea or vomiting Anesthetic complications: no    Last Vitals:  Vitals:   12/29/18 1420 12/29/18 1436  BP: 132/78 125/87  Pulse: 77 71  Resp: 16 17  Temp:  36.7 C  SpO2: 95% 94%    Last Pain:  Vitals:   12/29/18 1436  PainSc: 0-No pain                 Dashauna Heymann,W. EDMOND

## 2018-12-29 NOTE — Anesthesia Procedure Notes (Addendum)
Procedure Name: Intubation Date/Time: 12/29/2018 11:50 AM Performed by: Jasper Riling, RN Pre-anesthesia Checklist: Patient identified, Emergency Drugs available, Suction available and Patient being monitored Patient Re-evaluated:Patient Re-evaluated prior to induction Oxygen Delivery Method: Circle System Utilized Preoxygenation: Pre-oxygenation with 100% oxygen Induction Type: IV induction Laryngoscope Size: Mac and 3 Grade View: Grade I Tube type: Oral Tube size: 7.0 mm Number of attempts: 1 Airway Equipment and Method: Stylet and Oral airway Placement Confirmation: ETT inserted through vocal cords under direct vision,  positive ETCO2 and breath sounds checked- equal and bilateral Secured at: 24 cm Tube secured with: Tape Dental Injury: Teeth and Oropharynx as per pre-operative assessment

## 2018-12-29 NOTE — Brief Op Note (Signed)
12/29/2018  2:04 PM  PATIENT:  Roy Young  63 y.o. male  PRE-OPERATIVE DIAGNOSIS:  right eye, macular hole  POST-OPERATIVE DIAGNOSIS:  right eye, macular hole  PROCEDURE:  Procedure(s): 25 GAUGE PARS PLANA VITRECTOMY (Right)  SURGEON:  Surgeon(s) and Role:    [name], MD - Primary  ASSISTANTS: Ernest Mallick, Ophthalmic Assistant  ANESTHESIA:   general  EBL:  minimal   BLOOD ADMINISTERED:none  DRAINS: none   LOCAL MEDICATIONS USED:  NONE  SPECIMEN:  No Specimen  DISPOSITION OF SPECIMEN:  N/A  COUNTS:  YES  TOURNIQUET:  * No tourniquets in log *  DICTATION: .Note written in EPIC  PLAN OF CARE: Discharge to home after PACU  PATIENT DISPOSITION:  PACU - hemodynamically stable.   Delay start of Pharmacological VTE agent (>24hrs) due to surgical blood loss or risk of bleeding: not applicable

## 2018-12-29 NOTE — Op Note (Signed)
Date of procedure:12/29/2018  Surgeon: [name], MD,PhD  Assistant:Amanda Owens Shark, Ophthalmic Assistant  Pre-operative Diagnoses: Full thickness macular hole,RightEye  Post-operative diagnosis: Full thickness macular hole,Right Eye  Anesthesia: General  Procedure:  1. 25 gauge pars plana vitrectomy,Right Eye 2. Indocyanine green stain,Right Eye 3. Internal Limiting Membrane peel,Right Eye 4. [place] 5. Injection 14% C3F8,Right Eye   Indications for procedure: The patient presented with a full thickness macular hole, which was caught on routine eye exam. After discussing the risks, benefits, and alternatives to surgery, the patient's caretaker and medical power of attorney electively decided to undergo surgical repair and informed consent was obtained.The surgery was an attempt to close the macular hole and potentially improve the vision within the reasonable expectations of the surgeon.  Procedure in Detail:  The patient was met in the pre-operative holding area where their identification data was verified.It was noted that there was a signed, informed consent in the chart and theRight Eye eye was verbally verified by the patient's caretaker as the operative eye and was marked with a marking pen.The patient was then taken to the operating room and placed in the supine position.General endotracheal anesthesia was induced.  The eye was then prepped with 5% betadine and draped in the normal fashion for ophthalmic surgery.The microscope was draped and swung into position, and a secondary time-out was performed to identify the correct patient, eye, procedures, and any allergies.  A 25 gauge trocar was inserted in a 30-45 degrees fashion into the inferotemporal quadrant11m posterior tothelimbus in this phakic patient.Correct positioning within the vitreous was verified externally with the light pipe.The infusion was then connected to the  cannula and BSS infusion was commenced.Additional ports were placed in the superonasal and superotemporal quadrants.Viscoat was placed on the cornea. The BIOMwas used to visualize the posterior segment while the core vitrectomy was completed.The patient had a visible full thickness macular hole and a posterior vitreous detachment was induced using suction over the optic nerve head and lifting anteriorly. The remaining vitreous was removed. Kenalog was used to aid in this process. A thorough peripheral vitrectomy was performed.   Pre-diluted indocyanine green was then used to stain the internal limiting membrane. A macular contact lens was placed on the eye. End-grasping ILM forceps were used to create an opening in the ILM and the ILM was peeled fully from the macula taking care to avoid traction on the macular hole.  The wide angle viewing system was brought back into position. Scleral depression was performed and used to shave the vitreous baseand to inspect the peripheral retina.On 360 inspection, there were no retinal tears or defects. Peripheral 360 prophylactic endolaser was placed just posterior to ora under scleral depression.An air fluid exchange was performed.  The superotemporal port was then removed and sutured with 7-0 vicryl, there was no leakage.12% C3F8gas was connected to the infusion lineand gas was injected into the posterior segment while venting air through the superonasal trocar using the extrusion cannula. Once a full, 40cc ofgas was vented through the eye, the infusion port and venting ports were removed and they were sutured with 7-0 vicryl.There was no leakage from the sclerotomy sites.  Subconjunctival injections of kefzol+ bacitracin + polymixin b and kenalogwere then administered, and antibiotic and steroid drops as well as antibiotic ointment were placed in the eye.The drapes were removed and the eye was patched and shielded.A green gas bracelet was  placed on the patient's wrist. The patient was then taken to the post-operative area for recovery  having tolerated the procedure well.He was instructed to perform face down positioning postoperatively and to follow up in clinic the following morning as scheduled.  Estimate blood lost:none Complications: None

## 2018-12-30 ENCOUNTER — Other Ambulatory Visit: Payer: Self-pay

## 2018-12-30 ENCOUNTER — Ambulatory Visit (INDEPENDENT_AMBULATORY_CARE_PROVIDER_SITE_OTHER): Payer: Medicare HMO | Admitting: Ophthalmology

## 2018-12-30 ENCOUNTER — Encounter (HOSPITAL_COMMUNITY): Payer: Self-pay | Admitting: Ophthalmology

## 2018-12-30 DIAGNOSIS — H35033 Hypertensive retinopathy, bilateral: Secondary | ICD-10-CM

## 2018-12-30 DIAGNOSIS — H25813 Combined forms of age-related cataract, bilateral: Secondary | ICD-10-CM

## 2018-12-30 DIAGNOSIS — H3581 Retinal edema: Secondary | ICD-10-CM

## 2018-12-30 DIAGNOSIS — H35341 Macular cyst, hole, or pseudohole, right eye: Secondary | ICD-10-CM

## 2018-12-30 DIAGNOSIS — I10 Essential (primary) hypertension: Secondary | ICD-10-CM

## 2019-01-02 NOTE — Progress Notes (Signed)
[facility] Note  01/05/2019     CHIEF COMPLAINT Patient presents for Post-op Follow-up   HISTORY OF PRESENT ILLNESS: Roy Young is a 63 y.o. male who presents to the clinic today for:   HPI    Post-op Follow-up    In right eye.  Discomfort includes Negative for none.  Vision is stable.  I, the attending physician,  performed the HPI with the patient and updated documentation appropriately.          Comments    6 day S/P ppv for Mac Hole OD. Patient  Family member says no complaints from patient. Patient doesn't communicate very well.       Last edited by [name], MD on 01/05/2019  8:34 AM. (History)    pts caregiver says they are able to get the drops in as directed   Referring physician: [name], [name], [place] [place],  [place] [address]  HISTORICAL INFORMATION:   Selected notes from the [place] Referred by Dr. [name] for concern of ERM with mac hole OD LEE: 09.01.20 (J. [name]) [BCVA: OD: 20/60--, OS: 20/0--] Ocular Hx- PMH-developmental delay, DM, seizures   CURRENT MEDICATIONS: Current Outpatient Medications (Ophthalmic Drugs)  Medication Sig  . prednisoLONE acetate (PRED FORTE) 1 % ophthalmic suspension Place 1 drop into the right eye every 2 (two) hours while awake.   No current facility-administered medications for this visit.  (Ophthalmic Drugs)   Current Outpatient Medications (Other)  Medication Sig  . carbamazepine (CARBATROL) 300 MG 12 hr capsule Take 300-600 mg by mouth See admin instructions. Take 300 mg in the morning and 600 mg in the evening  . divalproex (DEPAKOTE ER) 250 MG 24 hr tablet Take 250-750 mg by mouth See admin instructions. Take 500 mg in the morning, 750 mg Mid-day and in the evening  . ergocalciferol (VITAMIN D2) 50000 units capsule Take 50,000 Units by mouth once a week.  . Lacosamide (VIMPAT) 100 MG TABS Take 100 mg by mouth 2 (two) times daily.  Marland Kitchen  levETIRAcetam (KEPPRA) 500 MG tablet Take 500 mg by mouth 2 (two) times daily.  Marland Kitchen loratadine (CLARITIN) 10 MG tablet Take 10 mg by mouth daily.  Marland Kitchen lovastatin (MEVACOR) 40 MG tablet Take 80 mg by mouth every evening.   . Multiple Vitamin (MULTI-VITAMIN) tablet Take 1 tablet by mouth daily. Chewable  . Multiple Vitamins-Minerals (MULTIVITAMIN ADULT PO) Take by mouth.  . propranolol (INDERAL) 10 MG tablet Take 10 mg by mouth 2 (two) times daily.   . sertraline (ZOLOFT) 100 MG tablet Take 100 mg by mouth at bedtime.    No current facility-administered medications for this visit.  (Other)      REVIEW OF SYSTEMS: ROS    Positive for: Neurological, Eyes, Respiratory   Negative for: Constitutional, Gastrointestinal, Skin, Genitourinary, Musculoskeletal, HENT, Endocrine, Cardiovascular, Psychiatric, Allergic/Imm, Heme/Lymph   Last edited by Elmore Guise, COT on 01/05/2019  8:10 AM. (History)       ALLERGIES No Known Allergies  PAST MEDICAL HISTORY Past Medical History:  Diagnosis Date  . Developmental delay   . Diabetes mellitus without complication ([place])    not on medication; diet-controlled  . Seizures ([place])   . Sleep apnea    Past Surgical History:  Procedure Laterality Date  . [place] VITRECTOMY WITH 20 GAUGE MVR PORT FOR MACULAR HOLE Right 12/29/2018   Procedure: 25 GAUGE PARS PLANA VITRECTOMY;  Surgeon: [name], MD;  Location: [facility];  Service: Ophthalmology;  Laterality: Right;    FAMILY HISTORY History reviewed. No pertinent family history.  SOCIAL HISTORY Social History   Tobacco Use  . Smoking status: Never Smoker  . Smokeless tobacco: Never Used  Substance Use Topics  . Alcohol use: No    Alcohol/week: 0.0 standard drinks  . Drug use: Never         OPHTHALMIC EXAM:  Base Eye Exam    Visual Acuity (Snellen - Linear)      Right Left   Dist [place] 20/150 20/60-1   Dist ph [place] 20/NI 20/NI       Tonometry (Tonopen, 8:11 AM)      Right  Left   Pressure 12        Pupils      Dark Light Shape React APD   Right        Left 3 2 Round Slow None       Visual Fields      Left Right    Full        Extraocular Movement      Right Left    Full Full       Neuro/Psych    Oriented x3: Yes   Mood/Affect: Normal       Dilation    Right eye: 1.0% Mydriacyl, 2.5% Phenylephrine @ 8:11 AM        Slit Lamp and Fundus Exam    Slit Lamp Exam      Right Left   Lids/Lashes Dermatochalasis - upper lid, mild Meibomian gland dysfunction Dermatochalasis - upper lid, mild Meibomian gland dysfunction   Conjunctiva/Sclera sutures intact, mild Injection White and quiet, mild melanosis   Cornea 4+ Punctate epithelial erosions, central epi defect - closed, heaped epi centrally, trace Descemet's folds trace Punctate epithelial erosions   Anterior Chamber Deep, 3-4+cell Deep and quiet   Iris Round and dilated Round and dilated   Lens 3+ Nuclear sclerosis with early brunescence, 2+ Cortical cataract 2-3+ Nuclear sclerosis with early brunescence, 2+ Cortical cataract   Vitreous post vitrectomy, good gas fill Mild Vitreous syneresis       Fundus Exam      Right Left   Disc Pink and Sharp, Peripapillary atrophy    C/D Ratio 0.5 0.5   Macula Flat, mac hole closing    Vessels Vascular attenuation, Copper wiring, AV crossing changes    Periphery Attached, No heme            IMAGING AND PROCEDURES  Imaging and Procedures for _0 @           ASSESSMENT/PLAN:    ICD-10-CM   1. Macular hole of right eye  H35.341   2. Retinal edema  H35.81   3. Essential hypertension  I10   4. Hypertensive retinopathy of both eyes  H35.033   5. Combined forms of age-related cataract of both eyes  H25.813     1,2. Full thickness mac hole OD  - pt with mental retardation and lives in a group home of 6 residents and 3 care givers  - discussed concerns about pt's ability to position post-operatively  - spoke with MPOA (sister) and care  taker   - now POW1 s/p PPV/ICG/MP/14% C3F8 OS, 10.15.20             - did well this week -- caretaker reports compliance with face down positioning             - retina attached  and good gas bubble in place w/ mac hole closing             - IOP 12  - good gas fill  - AC with significant cell/pigment             - inc PF every 2 hours while awake OD   - cont zymaxid QID OD -- stop when bottle runs out                         Atropine BID OD -- stop when bottle runs out   Brimonidine BID OD                         PSO ung bedtime only OD             - cont face down positioning 50% of time; avoid laying flat on back              - eye shield when sleeping x1 more week             - post op drop and positioning instructions reviewed              - tylenol/ibuprofen for pain  - f/u 1 week  3,4. Hypertensive retinopathy OU  - discussed importance of tight BP control  - monitor  5. Mixed form age related cataract OU  - The symptoms of cataract, surgical options, and treatments and risks were discussed with patient.  - discussed diagnosis and progression  - not yet visually significant  - monitor for now   Ophthalmic Meds Ordered this visit:  Meds ordered this encounter  Medications  . prednisoLONE acetate (PRED FORTE) 1 % ophthalmic suspension    Sig: Place 1 drop into the right eye every 2 (two) hours while awake.    Dispense:  15 mL    Refill:  0       Return in about 1 week (around 01/12/2019) for f/u POV.  There are no Patient Instructions on file for this visit.   Explained the diagnoses, plan, and follow up with the patient and they expressed understanding.  Patient expressed understanding of the importance of proper follow up care.   This document serves as a record of services personally performed by Gardiner Sleeper, MD, PhD. It was created on their behalf by Roselee Nova, COMT. The creation of this record is the provider's dictation and/or activities during the  visit.  Electronically signed by: Roselee Nova, COMT 01/08/19 12:39 AM  Gardiner Sleeper, M.D., Ph.D. Diseases & Surgery of the Retina and [place] 01/08/19  I have reviewed the above documentation for accuracy and completeness, and I agree with the above. Gardiner Sleeper, M.D., Ph.D. 01/08/19 12:41 AM   Abbreviations: M myopia (nearsighted); A astigmatism; H hyperopia (farsighted); P presbyopia; Mrx spectacle prescription;  CTL contact lenses; OD right eye; OS left eye; OU both eyes  XT exotropia; ET esotropia; PEK punctate epithelial keratitis; PEE punctate epithelial erosions; DES dry eye syndrome; MGD meibomian gland dysfunction; ATs artificial tears; PFAT's preservative free artificial tears; [place] nuclear sclerotic cataract; PSC posterior subcapsular cataract; ERM epi-retinal membrane; PVD posterior vitreous detachment; RD retinal detachment; DM diabetes mellitus; DR diabetic retinopathy; NPDR non-proliferative diabetic retinopathy; PDR proliferative diabetic retinopathy; CSME clinically significant macular edema; DME diabetic macular edema; dbh dot blot hemorrhages; CWS cotton wool spot; POAG primary open  angle glaucoma; C/D cup-to-disc ratio; HVF humphrey visual field; GVF goldmann visual field; OCT optical coherence tomography; IOP intraocular pressure; BRVO Branch retinal vein occlusion; CRVO central retinal vein occlusion; CRAO central retinal artery occlusion; BRAO branch retinal artery occlusion; RT retinal tear; SB scleral buckle; PPV pars plana vitrectomy; VH Vitreous hemorrhage; PRP panretinal laser photocoagulation; IVK intravitreal kenalog; VMT vitreomacular traction; MH Macular hole;  NVD neovascularization of the disc; NVE neovascularization elsewhere; AREDS age related eye disease study; ARMD age related macular degeneration; POAG primary open angle glaucoma; EBMD epithelial/anterior basement membrane dystrophy; ACIOL anterior chamber intraocular  lens; IOL intraocular lens; PCIOL posterior chamber intraocular lens; Phaco/IOL phacoemulsification with intraocular lens placement; [place] photorefractive keratectomy; LASIK laser assisted in situ keratomileusis; HTN hypertension; DM diabetes mellitus; COPD chronic obstructive pulmonary disease

## 2019-01-05 ENCOUNTER — Ambulatory Visit (INDEPENDENT_AMBULATORY_CARE_PROVIDER_SITE_OTHER): Payer: Medicare HMO | Admitting: Ophthalmology

## 2019-01-05 ENCOUNTER — Encounter (INDEPENDENT_AMBULATORY_CARE_PROVIDER_SITE_OTHER): Payer: Self-pay | Admitting: Ophthalmology

## 2019-01-05 DIAGNOSIS — H35033 Hypertensive retinopathy, bilateral: Secondary | ICD-10-CM

## 2019-01-05 DIAGNOSIS — I10 Essential (primary) hypertension: Secondary | ICD-10-CM

## 2019-01-05 DIAGNOSIS — H3581 Retinal edema: Secondary | ICD-10-CM

## 2019-01-05 DIAGNOSIS — H25813 Combined forms of age-related cataract, bilateral: Secondary | ICD-10-CM

## 2019-01-05 DIAGNOSIS — H35341 Macular cyst, hole, or pseudohole, right eye: Secondary | ICD-10-CM

## 2019-01-05 MED ORDER — PREDNISOLONE ACETATE 1 % OP SUSP: 1.0000 [drp] | OPHTHALMIC | 0 refills | Status: DC

## 2019-01-09 NOTE — Progress Notes (Signed)
[facility] Note  01/12/2019     CHIEF COMPLAINT Patient presents for Post-op Follow-up   HISTORY OF PRESENT ILLNESS: Roy Young is a 63 y.o. male who presents to the clinic today for:   HPI    Post-op Follow-up    In right eye.  Vision is stable.  I, the attending physician,  performed the HPI with the patient and updated documentation appropriately.          Comments    Patients caregiver states patient is not complaining of any pain or discomfort or change in vision.       Last edited by [name], MD on 01/12/2019  8:11 AM. (History)    pts caregiver states they returned the chair on Monday and he was doing 30 mins/hr of face down time, since then he has been sleeping on his left side with his face down,    Referring physician: [name], [name], [address] [place],  [place] [id number]  HISTORICAL INFORMATION:   Selected notes from the [place] [facility] Referred by Dr. [name] for concern of ERM with mac hole OD LEE: 09.01.20 (J. [name]) [BCVA: OD: 20/60--, OS: 20/0--] Ocular Hx- PMH-developmental delay, DM, seizures   CURRENT MEDICATIONS: Current Outpatient Medications (Ophthalmic Drugs)  Medication Sig  . gatifloxacin (ZYMAXID) 0.5 % SOLN Place 1 drop into the right eye 4 (four) times daily.  . prednisoLONE acetate (PRED FORTE) 1 % ophthalmic suspension Place 1 drop into the right eye every 2 (two) hours while awake.   No current facility-administered medications for this visit.  (Ophthalmic Drugs)   Current Outpatient Medications (Other)  Medication Sig  . carbamazepine (CARBATROL) 300 MG 12 hr capsule Take 300-600 mg by mouth See admin instructions. Take 300 mg in the morning and 600 mg in the evening  . divalproex (DEPAKOTE ER) 250 MG 24 hr tablet Take 250-750 mg by mouth See admin instructions. Take 500 mg in the morning, 750 mg Mid-day and in the evening  . ergocalciferol (VITAMIN D2) 50000 units  capsule Take 50,000 Units by mouth once a week.  . Lacosamide (VIMPAT) 100 MG TABS Take 100 mg by mouth 2 (two) times daily.  Marland Kitchen levETIRAcetam (KEPPRA) 500 MG tablet Take 500 mg by mouth 2 (two) times daily.  Marland Kitchen loratadine (CLARITIN) 10 MG tablet Take 10 mg by mouth daily.  Marland Kitchen lovastatin (MEVACOR) 40 MG tablet Take 80 mg by mouth every evening.   . Multiple Vitamin (MULTI-VITAMIN) tablet Take 1 tablet by mouth daily. Chewable  . Multiple Vitamins-Minerals (MULTIVITAMIN ADULT PO) Take by mouth.  . propranolol (INDERAL) 10 MG tablet Take 10 mg by mouth 2 (two) times daily.   . sertraline (ZOLOFT) 100 MG tablet Take 100 mg by mouth at bedtime.    No current facility-administered medications for this visit.  (Other)      REVIEW OF SYSTEMS: ROS    Positive for: Neurological, Eyes, Respiratory   Negative for: Constitutional, Gastrointestinal, Skin, Genitourinary, Musculoskeletal, HENT, Endocrine, Cardiovascular, Psychiatric, Allergic/Imm, Heme/Lymph   Last edited by Doneen Poisson on 01/12/2019  7:55 AM. (History)       ALLERGIES No Known Allergies  PAST MEDICAL HISTORY Past Medical History:  Diagnosis Date  . Developmental delay   . Diabetes mellitus without complication ([place])    not on medication; diet-controlled  . Seizures ([place])   . Sleep apnea    Past Surgical History:  Procedure Laterality Date  . 25 GAUGE  PARS PLANA VITRECTOMY WITH 20 GAUGE MVR PORT FOR MACULAR HOLE Right 12/29/2018   Procedure: 25 GAUGE PARS PLANA VITRECTOMY;  Surgeon: [name], MD;  Location: [facility];  Service: Ophthalmology;  Laterality: Right;    FAMILY HISTORY History reviewed. No pertinent family history.  SOCIAL HISTORY Social History   Tobacco Use  . Smoking status: Never Smoker  . Smokeless tobacco: Never Used  Substance Use Topics  . Alcohol use: No    Alcohol/week: 0.0 standard drinks  . Drug use: Never         OPHTHALMIC EXAM:  Base Eye Exam    Visual Acuity (Snellen -  Linear)      Right Left   Dist [place] 20/150 -2 20/50 +2   Dist ph [place] NI 20/40 -2       Tonometry (Tonopen, 8:01 AM)      Right Left   Pressure 12        Pupils      Dark Light Shape React APD   Right 6 6 Round dilated 0   Left 5 4 Round Minimal 0       Extraocular Movement      Right Left    Full Full       Neuro/Psych    Oriented x3: Yes   Mood/Affect: Normal       Dilation    Right eye: 1.0% Mydriacyl, 2.5% Phenylephrine @ 8:01 AM        Slit Lamp and Fundus Exam    Slit Lamp Exam      Right Left   Lids/Lashes Dermatochalasis - upper lid, mild Meibomian gland dysfunction Dermatochalasis - upper lid, mild Meibomian gland dysfunction   Conjunctiva/Sclera sutures intact, trace Injection White and quiet, mild melanosis   Cornea 2x4 epi defect with rolled edges, surrounidng corneal haze, 3+ Punctate epithelial erosions, trace Descemet's folds trace Punctate epithelial erosions   Anterior Chamber Deep, 3-4+cell Deep and quiet   Iris Round and dilated Round and dilated   Lens 3+ Nuclear sclerosis with early brunescence, 2+ Cortical cataract 2-3+ Nuclear sclerosis with early brunescence, 2+ Cortical cataract   Vitreous post vitrectomy, good gas fill Mild Vitreous syneresis       Fundus Exam      Right Left   Disc Pink and Sharp, Peripapillary atrophy    C/D Ratio 0.5 0.5   Macula Flat, mac hole closing    Vessels Vascular attenuation, Copper wiring, AV crossing changes    Periphery Attached, No heme          Refraction    Wearing Rx      Sphere Cylinder Axis Add   Right -0.25 +1.25 013 +2.25   Left -0.75 +1.00 170 +2.25   Type: Bifocal  Did not bring          IMAGING AND PROCEDURES  Imaging and Procedures for _0 @  OCT, Retina - OU - Both Eyes       Right Eye Quality was poor.   Left Eye Quality was good. Central Foveal Thickness: 245. Progression has been stable. Findings include normal foveal contour, no IRF, no SRF, vitreomacular adhesion .    Notes *Images captured and stored on drive  Diagnosis / Impression:  OD: no image OS: NFP, no IRF/SRF, VMA  Clinical management:  See below  Abbreviations: NFP - Normal foveal profile. CME - cystoid macular edema. PED - pigment epithelial detachment. IRF - intraretinal fluid. SRF - subretinal fluid. EZ - ellipsoid zone. ERM -  epiretinal membrane. ORA - outer retinal atrophy. ORT - outer retinal tubulation. SRHM - subretinal hyper-reflective material                 ASSESSMENT/PLAN:    ICD-10-CM   1. Macular hole of right eye  H35.341   2. Retinal edema  H35.81 OCT, Retina - OU - Both Eyes  3. Essential hypertension  I10   4. Hypertensive retinopathy of both eyes  H35.033   5. Combined forms of age-related cataract of both eyes  H25.813     1,2. Full thickness mac hole OD  - pt with mental retardation and lives in a group home of 6 residents and 3 care givers  - now POW2 s/p PPV/ICG/MP/14% C3F8 OS, 10.15.20             - did well this week -- caretaker reports compliance with face down positioning             - retina attached and good gas bubble in place w/ mac hole closing  - today, 2x4 epi defect with rolled edges - BCL placed ([employer], N&D, BC: 8.6, DIA: 13.8)             - IOP 12  - good gas fill  - AC with improved cell/pigment  - restart gatifloxacin QID OD -- Rx sent to pharmacy             - decrease PF to QID OD   - decrease Brimonidine to QD OD  - PSO ung bedtime only OD -- d/c while BCL in place             - avoid laying flat on back              - eye shield at all times             - post op drop and positioning instructions reviewed              - tylenol/ibuprofen for pain  - f/u Monday, November 2 -- POV and K check  3,4. Hypertensive retinopathy OU  - discussed importance of tight BP control  - monitor  5. Mixed form age related cataract OU  - The symptoms of cataract, surgical options, and treatments and risks were discussed with  patient.  - discussed diagnosis and progression  - not yet visually significant  - monitor for now   Ophthalmic Meds Ordered this visit:  Meds ordered this encounter  Medications  . gatifloxacin (ZYMAXID) 0.5 % SOLN    Sig: Place 1 drop into the right eye 4 (four) times daily.    Dispense:  2.5 mL    Refill:  1       Return in about 4 days (around 01/16/2019) for POV, K check.  There are no Patient Instructions on file for this visit.   Explained the diagnoses, plan, and follow up with the patient and they expressed understanding.  Patient expressed understanding of the importance of proper follow up care.    Electronically signed by: Leeann Must, [place] 10.26.2020 10:27AM  This document serves as a record of services personally performed by Gardiner Sleeper, MD, PhD. It was created on their behalf by Ernest Mallick, OA, an ophthalmic assistant. The creation of this record is the provider's dictation and/or activities during the visit.    Electronically signed by: Ernest Mallick, OA 10.29.2020 12:18 AM   Gardiner Sleeper, M.D., Ph.D. Diseases & Surgery  of the Retina and Vitreous [facility]  I have reviewed the above documentation for accuracy and completeness, and I agree with the above. Gardiner Sleeper, M.D., Ph.D. 01/13/19 12:18 AM    Abbreviations: M myopia (nearsighted); A astigmatism; H hyperopia (farsighted); P presbyopia; Mrx spectacle prescription;  CTL contact lenses; OD right eye; OS left eye; OU both eyes  XT exotropia; ET esotropia; PEK punctate epithelial keratitis; PEE punctate epithelial erosions; DES dry eye syndrome; MGD meibomian gland dysfunction; ATs artificial tears; PFAT's preservative free artificial tears; [place] nuclear sclerotic cataract; PSC posterior subcapsular cataract; ERM epi-retinal membrane; PVD posterior vitreous detachment; RD retinal detachment; DM diabetes mellitus; DR diabetic retinopathy; NPDR non-proliferative diabetic  retinopathy; PDR proliferative diabetic retinopathy; CSME clinically significant macular edema; DME diabetic macular edema; dbh dot blot hemorrhages; CWS cotton wool spot; POAG primary open angle glaucoma; C/D cup-to-disc ratio; HVF humphrey visual field; GVF goldmann visual field; OCT optical coherence tomography; IOP intraocular pressure; BRVO Branch retinal vein occlusion; CRVO central retinal vein occlusion; CRAO central retinal artery occlusion; BRAO branch retinal artery occlusion; RT retinal tear; SB scleral buckle; PPV pars plana vitrectomy; VH Vitreous hemorrhage; PRP panretinal laser photocoagulation; IVK intravitreal kenalog; VMT vitreomacular traction; MH Macular hole;  NVD neovascularization of the disc; NVE neovascularization elsewhere; AREDS age related eye disease study; ARMD age related macular degeneration; POAG primary open angle glaucoma; EBMD epithelial/anterior basement membrane dystrophy; ACIOL anterior chamber intraocular lens; IOL intraocular lens; PCIOL posterior chamber intraocular lens; Phaco/IOL phacoemulsification with intraocular lens placement; [place] photorefractive keratectomy; LASIK laser assisted in situ keratomileusis; HTN hypertension; DM diabetes mellitus; COPD chronic obstructive pulmonary disease

## 2019-01-12 ENCOUNTER — Encounter (INDEPENDENT_AMBULATORY_CARE_PROVIDER_SITE_OTHER): Payer: Self-pay | Admitting: Ophthalmology

## 2019-01-12 ENCOUNTER — Ambulatory Visit (INDEPENDENT_AMBULATORY_CARE_PROVIDER_SITE_OTHER): Payer: Medicare HMO | Admitting: Ophthalmology

## 2019-01-12 ENCOUNTER — Other Ambulatory Visit: Payer: Self-pay

## 2019-01-12 DIAGNOSIS — H25813 Combined forms of age-related cataract, bilateral: Secondary | ICD-10-CM

## 2019-01-12 DIAGNOSIS — H35341 Macular cyst, hole, or pseudohole, right eye: Secondary | ICD-10-CM

## 2019-01-12 DIAGNOSIS — H3581 Retinal edema: Secondary | ICD-10-CM | POA: Diagnosis not present

## 2019-01-12 DIAGNOSIS — H35033 Hypertensive retinopathy, bilateral: Secondary | ICD-10-CM

## 2019-01-12 DIAGNOSIS — I10 Essential (primary) hypertension: Secondary | ICD-10-CM

## 2019-01-12 MED ORDER — GATIFLOXACIN 0.5 % OP SOLN: 1.0000 [drp] | Freq: Four times a day (QID) | OPHTHALMIC | 1 refills | Status: DC

## 2019-01-16 ENCOUNTER — Encounter (INDEPENDENT_AMBULATORY_CARE_PROVIDER_SITE_OTHER): Payer: Medicare HMO | Admitting: Ophthalmology

## 2019-01-16 NOTE — Progress Notes (Signed)
[facility] Note  01/17/2019     CHIEF COMPLAINT Patient presents for Retina Follow Up   HISTORY OF PRESENT ILLNESS: Roy Young is a 63 y.o. male who presents to the clinic today for:   HPI    Retina Follow Up    Patient presents with  Other.  In right eye.  This started 4 days ago.  Severity is moderate.  I, the attending physician,  performed the HPI with the patient and updated documentation appropriately.          Comments    Patient here for 4 days follow up for RRV/MP OS 10.15.20. Patient states vision doing good. No eye pain.        Last edited by [name], MD on 01/17/2019 12:56 PM. (History)    pts caregiver states he has been wearing the eye patch continuously since his last visit here  Referring physician: [name], [name], [place] [place],  [place] [address]  HISTORICAL INFORMATION:   Selected notes from the [facility] Referred by Dr. [name] for concern of ERM with mac hole OD   CURRENT MEDICATIONS: Current Outpatient Medications (Ophthalmic Drugs)  Medication Sig  . gatifloxacin (ZYMAXID) 0.5 % SOLN Place 1 drop into the right eye 4 (four) times daily.  . prednisoLONE acetate (PRED FORTE) 1 % ophthalmic suspension Place 1 drop into the right eye every 2 (two) hours while awake.   No current facility-administered medications for this visit.  (Ophthalmic Drugs)   Current Outpatient Medications (Other)  Medication Sig  . carbamazepine (CARBATROL) 300 MG 12 hr capsule Take 300-600 mg by mouth See admin instructions. Take 300 mg in the morning and 600 mg in the evening  . divalproex (DEPAKOTE ER) 250 MG 24 hr tablet Take 250-750 mg by mouth See admin instructions. Take 500 mg in the morning, 750 mg Mid-day and in the evening  . ergocalciferol (VITAMIN D2) 50000 units capsule Take 50,000 Units by mouth once a week.  . Lacosamide (VIMPAT) 100 MG TABS Take 100 mg by mouth 2 (two) times daily.  Marland Kitchen  levETIRAcetam (KEPPRA) 500 MG tablet Take 500 mg by mouth 2 (two) times daily.  Marland Kitchen loratadine (CLARITIN) 10 MG tablet Take 10 mg by mouth daily.  Marland Kitchen lovastatin (MEVACOR) 40 MG tablet Take 80 mg by mouth every evening.   . Multiple Vitamin (MULTI-VITAMIN) tablet Take 1 tablet by mouth daily. Chewable  . Multiple Vitamins-Minerals (MULTIVITAMIN ADULT PO) Take by mouth.  . propranolol (INDERAL) 10 MG tablet Take 10 mg by mouth 2 (two) times daily.   . sertraline (ZOLOFT) 100 MG tablet Take 100 mg by mouth at bedtime.    No current facility-administered medications for this visit.  (Other)      REVIEW OF SYSTEMS: ROS    Positive for: Neurological, Eyes, Respiratory   Negative for: Constitutional, Gastrointestinal, Skin, Genitourinary, Musculoskeletal, HENT, Endocrine, Cardiovascular, Psychiatric, Allergic/Imm, Heme/Lymph   Last edited by Theodore Demark, COA on 01/17/2019 10:08 AM. (History)       ALLERGIES No Known Allergies  PAST MEDICAL HISTORY Past Medical History:  Diagnosis Date  . Developmental delay   . Diabetes mellitus without complication ([place])    not on medication; diet-controlled  . Seizures ([place])   . Sleep apnea    Past Surgical History:  Procedure Laterality Date  . [place] VITRECTOMY WITH 20 GAUGE MVR PORT FOR MACULAR HOLE Right 12/29/2018   Procedure: 25  GAUGE PARS PLANA VITRECTOMY;  Surgeon: [name], MD;  Location: [facility];  Service: Ophthalmology;  Laterality: Right;    FAMILY HISTORY History reviewed. No pertinent family history.  SOCIAL HISTORY Social History   Tobacco Use  . Smoking status: Never Smoker  . Smokeless tobacco: Never Used  Substance Use Topics  . Alcohol use: No    Alcohol/week: 0.0 standard drinks  . Drug use: Never         OPHTHALMIC EXAM:  Base Eye Exam    Visual Acuity (Snellen - Linear)      Right Left   Dist [place] 20/400 20/50 -1   Dist ph [place] NI NI       Tonometry (Tonopen, 10:03 AM)      Right Left    Pressure 10 23  Patient squeezed real hard. I got 24 and 23 in OS       Pupils      Dark Light Shape React APD   Right 6 6 Round Dilated None   Left 5 4 Round Minimal None       Extraocular Movement      Right Left    Full, Ortho Full, Ortho       Neuro/Psych    Oriented x3: Yes   Mood/Affect: Normal       Dilation    Both eyes: 1.0% Mydriacyl, 2.5% Phenylephrine @ 10:03 AM        Slit Lamp and Fundus Exam    Slit Lamp Exam      Right Left   Lids/Lashes Dermatochalasis - upper lid, mild Meibomian gland dysfunction Dermatochalasis - upper lid, mild Meibomian gland dysfunction   Conjunctiva/Sclera sutures intact, trace Injection White and quiet, mild melanosis   Cornea BCL gone; 1.5x3 epi defect with rolled edges, surrounidng corneal haze, 3+ Punctate epithelial erosions, trace Descemet's folds trace Punctate epithelial erosions   Anterior Chamber Deep, 2+cell Deep and quiet   Iris Round and dilated Round and dilated   Lens 3+ Nuclear sclerosis with early brunescence, 2+ Cortical cataract 2-3+ Nuclear sclerosis with early brunescence, 2+ Cortical cataract   Vitreous post vitrectomy, gas bubble ~60% Mild Vitreous syneresis       Fundus Exam      Right Left   Disc Pink and Sharp, Peripapillary atrophy Pink and Sharp   C/D Ratio 0.5 0.5   Macula Flat, mac hole closing Flat, Good foveal reflex, Retinal pigment epithelial mottling, No heme or edema   Vessels Vascular attenuation, Copper wiring, AV crossing changes Vascular attenuation, Copper wiring, AV crossing changes   Periphery Attached, No heme  Attached, No heme         Refraction    Wearing Rx      Sphere Cylinder Axis Add   Right -0.25 +1.25 013 +2.25   Left -0.75 +1.00 170 +2.25   Type: Bifocal          IMAGING AND PROCEDURES  Imaging and Procedures for _0 @           ASSESSMENT/PLAN:    ICD-10-CM   1. Macular hole of right eye  H35.341   2. Retinal edema  H35.81   3. Essential  hypertension  I10   4. Hypertensive retinopathy of both eyes  H35.033   5. Combined forms of age-related cataract of both eyes  H25.813     1,2. Full thickness mac hole OD  - pt with mental retardation and lives in a group home of 6 residents and 3 care  givers  - s/p PPV/ICG/MP/14% C3F8 OS, 10.15.20             - here for K check             - retina attached and good gas bubble in place w/ mac hole closing  - today, epi defect improved to 1.5x3 from 2x4 on Friday -- though BCL was not in place  - new BCL placed today [employer], N[employer], BC: 8.6, DIA: 13.8)             - IOP 10  - gas bubble ~60-70%  - AC with improved cell/pigment  - cont gatifloxacin QID OD              - cont PF to QID OD   - cont Brimonidine to QD OD  - PSO ung bedtime only OD -- d/c while BCL in place             - avoid laying flat on back              - eye shield at all times             - post op drop and positioning instructions reviewed              - tylenol/ibuprofen for pain  - f/u Friday, November 6 for K recheck  - of note, soon after pt left, caretaker called to let us know that BCL was already rubbed out by pt  - advised PSO q2 hours to protect cornea  - f/u Friday as scheduled  3,4. Hypertensive retinopathy OU  - discussed importance of tight BP control  - monitor  5. Mixed form age related cataract OU  - The symptoms of cataract, surgical options, and treatments and risks were discussed with patient.  - discussed diagnosis and progression  - not yet visually significant  - monitor for now   Ophthalmic Meds Ordered this visit:  No orders of the defined types were placed in this encounter.      Return in about 3 days (around 01/20/2019) for f/u epi defect OD.  There are no Patient Instructions on file for this visit.   Explained the diagnoses, plan, and follow up with the patient and they expressed understanding.  Patient expressed understanding of the importance of proper follow  up care.    This document serves as a record of services personally performed by Gardiner Sleeper, MD, PhD. It was created on their behalf by Estill Bakes, COT an ophthalmic technician. The creation of this record is the provider's dictation and/or activities during the visit.    Electronically signed by: Estill Bakes, COT 01/16/19 @ 12:59 PM  Gardiner Sleeper, M.D., Ph.D. Diseases & Surgery of the Retina and [place] 01/17/19  I have reviewed the above documentation for accuracy and completeness, and I agree with the above. Gardiner Sleeper, M.D., Ph.D. 01/17/19 12:59 PM     Abbreviations: M myopia (nearsighted); A astigmatism; H hyperopia (farsighted); P presbyopia; Mrx spectacle prescription;  CTL contact lenses; OD right eye; OS left eye; OU both eyes  XT exotropia; ET esotropia; PEK punctate epithelial keratitis; PEE punctate epithelial erosions; DES dry eye syndrome; MGD meibomian gland dysfunction; ATs artificial tears; PFAT's preservative free artificial tears; [place] nuclear sclerotic cataract; PSC posterior subcapsular cataract; ERM epi-retinal membrane; PVD posterior vitreous detachment; RD retinal detachment; DM diabetes mellitus; DR diabetic retinopathy; NPDR non-proliferative diabetic  retinopathy; PDR proliferative diabetic retinopathy; CSME clinically significant macular edema; DME diabetic macular edema; dbh dot blot hemorrhages; CWS cotton wool spot; POAG primary open angle glaucoma; C/D cup-to-disc ratio; HVF humphrey visual field; GVF goldmann visual field; OCT optical coherence tomography; IOP intraocular pressure; BRVO Branch retinal vein occlusion; CRVO central retinal vein occlusion; CRAO central retinal artery occlusion; BRAO branch retinal artery occlusion; RT retinal tear; SB scleral buckle; PPV pars plana vitrectomy; VH Vitreous hemorrhage; PRP panretinal laser photocoagulation; IVK intravitreal kenalog; VMT vitreomacular traction; MH Macular  hole;  NVD neovascularization of the disc; NVE neovascularization elsewhere; AREDS age related eye disease study; ARMD age related macular degeneration; POAG primary open angle glaucoma; EBMD epithelial/anterior basement membrane dystrophy; ACIOL anterior chamber intraocular lens; IOL intraocular lens; PCIOL posterior chamber intraocular lens; Phaco/IOL phacoemulsification with intraocular lens placement; [place] photorefractive keratectomy; LASIK laser assisted in situ keratomileusis; HTN hypertension; DM diabetes mellitus; COPD chronic obstructive pulmonary disease

## 2019-01-17 ENCOUNTER — Encounter (INDEPENDENT_AMBULATORY_CARE_PROVIDER_SITE_OTHER): Payer: Self-pay | Admitting: Ophthalmology

## 2019-01-17 ENCOUNTER — Telehealth (INDEPENDENT_AMBULATORY_CARE_PROVIDER_SITE_OTHER): Payer: Self-pay

## 2019-01-17 ENCOUNTER — Ambulatory Visit (INDEPENDENT_AMBULATORY_CARE_PROVIDER_SITE_OTHER): Payer: Medicare HMO | Admitting: Ophthalmology

## 2019-01-17 DIAGNOSIS — H3581 Retinal edema: Secondary | ICD-10-CM

## 2019-01-17 DIAGNOSIS — H25813 Combined forms of age-related cataract, bilateral: Secondary | ICD-10-CM

## 2019-01-17 DIAGNOSIS — H35341 Macular cyst, hole, or pseudohole, right eye: Secondary | ICD-10-CM

## 2019-01-17 DIAGNOSIS — H35033 Hypertensive retinopathy, bilateral: Secondary | ICD-10-CM

## 2019-01-17 DIAGNOSIS — I10 Essential (primary) hypertension: Secondary | ICD-10-CM

## 2019-01-18 NOTE — Progress Notes (Signed)
Roy Young  01/20/2019     CHIEF COMPLAINT Patient presents for Post-op Follow-up   HISTORY OF PRESENT ILLNESS: Roy Young is a 63 y.o. male who presents to the clinic today for:   HPI    Post-op Follow-up    In right eye.  Vision is stable.  I, the attending physician,  performed the HPI with the patient and updated documentation appropriately.          Comments    Patient's cargiver states there are no complaints of change in vision, pain or discomfort.       Last edited by [name], MD on 01/21/2019 12:53 AM. (History)    pts caregiver states he knocked the contact lens out of his eye as soon as they got home last time, she states she was able to use the ointment every 2 hours since last visit,   Referring physician: [name], [name], [address],  [place] [address]  HISTORICAL INFORMATION:   Selected notes from the [facility] Referred by Dr. [name] for concern of ERM with mac hole OD   CURRENT MEDICATIONS: Current Outpatient Medications (Ophthalmic Drugs)  Medication Sig  . gatifloxacin (ZYMAXID) 0.5 % SOLN Place 1 drop into the right eye 4 (four) times daily.  . prednisoLONE acetate (PRED FORTE) 1 % ophthalmic suspension Place 1 drop into the right eye every 2 (two) hours while awake.   No current facility-administered medications for this visit.  (Ophthalmic Drugs)   Current Outpatient Medications (Other)  Medication Sig  . carbamazepine (CARBATROL) 300 MG 12 hr capsule Take 300-600 mg by mouth See admin instructions. Take 300 mg in the morning and 600 mg in the evening  . divalproex (DEPAKOTE ER) 250 MG 24 hr tablet Take 250-750 mg by mouth See admin instructions. Take 500 mg in the morning, 750 mg Mid-day and in the evening  . ergocalciferol (VITAMIN D2) 50000 units capsule Take 50,000 Units by mouth once a week.  . Lacosamide (VIMPAT) 100 MG TABS Take 100 mg by mouth 2 (two) times  daily.  Marland Kitchen levETIRAcetam (KEPPRA) 500 MG tablet Take 500 mg by mouth 2 (two) times daily.  Marland Kitchen loratadine (CLARITIN) 10 MG tablet Take 10 mg by mouth daily.  Marland Kitchen lovastatin (MEVACOR) 40 MG tablet Take 80 mg by mouth every evening.   . Multiple Vitamin (MULTI-VITAMIN) tablet Take 1 tablet by mouth daily. Chewable  . Multiple Vitamins-Minerals (MULTIVITAMIN ADULT PO) Take by mouth.  . propranolol (INDERAL) 10 MG tablet Take 10 mg by mouth 2 (two) times daily.   . sertraline (ZOLOFT) 100 MG tablet Take 100 mg by mouth at bedtime.    No current facility-administered medications for this visit.  (Other)      REVIEW OF SYSTEMS: ROS    Positive for: Neurological, Eyes, Respiratory   Negative for: Constitutional, Gastrointestinal, Skin, Genitourinary, Musculoskeletal, HENT, Endocrine, Cardiovascular, Psychiatric, Allergic/Imm, Heme/Lymph   Last edited by Doneen Poisson on 01/20/2019 12:57 PM. (History)       ALLERGIES No Known Allergies  PAST MEDICAL HISTORY Past Medical History:  Diagnosis Date  . Developmental delay   . Diabetes mellitus without complication ([place])    not on medication; diet-controlled  . Seizures ([place])   . Sleep apnea    Past Surgical History:  Procedure Laterality Date  . [place] VITRECTOMY WITH 20 GAUGE MVR PORT FOR MACULAR HOLE Right 12/29/2018   Procedure: 25 GAUGE  PARS PLANA VITRECTOMY;  Surgeon: [name], MD;  Location: [facility];  Service: Ophthalmology;  Laterality: Right;    FAMILY HISTORY History reviewed. No pertinent family history.  SOCIAL HISTORY Social History   Tobacco Use  . Smoking status: Never Smoker  . Smokeless tobacco: Never Used  Substance Use Topics  . Alcohol use: No    Alcohol/week: 0.0 standard drinks  . Drug use: Never         OPHTHALMIC EXAM:  Base Eye Exam    Visual Acuity (Snellen - Linear)      Right Left   Dist [place] 20/400 20/40 -2   Dist ph [place] NI NI       Tonometry (Tonopen, 1:00 PM)      Right  Left   Pressure 12 def       Pupils      Dark Light Shape React APD   Right 6 6 Round Dilated 0   Left 4 3 Round Minimal 0  Hazy OD       Extraocular Movement      Right Left    Full Full       Neuro/Psych    Oriented x3: Yes   Mood/Affect: Normal       Dilation    Right eye: 1.0% Mydriacyl, 2.5% Phenylephrine @ 1:00 PM        Slit Lamp and Fundus Exam    Slit Lamp Exam      Right Left   Lids/Lashes Dermatochalasis - upper lid, mild Meibomian gland dysfunction Dermatochalasis - upper lid, mild Meibomian gland dysfunction   Conjunctiva/Sclera sutures intact, trace Injection White and quiet, mild melanosis   Cornea New BCL placed; 1.5x2.75 epi defect with rolled edges, surrounidng corneal haze, 3-4+ Punctate epithelial erosions, trace Descemet's folds trace Punctate epithelial erosions   Anterior Chamber Deep, 2+cell Deep and quiet   Iris Round and dilated Round and dilated   Lens 3+ Nuclear sclerosis with early brunescence, 2+ Cortical cataract 2-3+ Nuclear sclerosis with early brunescence, 2+ Cortical cataract   Vitreous post vitrectomy, gas bubble ~55-60% Mild Vitreous syneresis       Fundus Exam      Right Left   Disc Pink and Sharp, Peripapillary atrophy Pink and Sharp   C/D Ratio 0.5 0.5   Macula Flat, mac hole closing Flat, Good foveal reflex, Retinal pigment epithelial mottling, No heme or edema   Vessels Vascular attenuation, Copper wiring, AV crossing changes Vascular attenuation, Copper wiring, AV crossing changes   Periphery Attached, No heme  Attached, No heme         Refraction    Wearing Rx      Sphere Cylinder Axis Add   Right -0.25 +1.25 013 +2.25   Left -0.75 +1.00 170 +2.25   Type: Bifocal          IMAGING AND PROCEDURES  Imaging and Procedures for _0 @           ASSESSMENT/PLAN:    ICD-10-CM   1. Macular hole of right eye  H35.341   2. Retinal edema  H35.81   3. Essential hypertension  I10   4. Hypertensive retinopathy of  both eyes  H35.033   5. Combined forms of age-related cataract of both eyes  H25.813     1,2. Full thickness mac hole OD  - pt with mental retardation and lives in a group home of 6 residents and 3 care givers  - s/p PPV/ICG/MP/14% C3F8 OD, 10.15.20             -  here for K check             - retina attached and good gas bubble in place w/ mac hole closing  - today, epi defect improved to 1.5x2.75 from 1.5x3 on Friday -- though BCL was not in place  - new BCL placed today [employer], [employer], BC: 8.4, DIA: 13.8)             - IOP 12  - gas bubble ~55-60%  - AC with improved cell/pigment  - cont gatifloxacin QID OD              - cont PF to QID OD   - cont Brimonidine to QD OD  - PSO ung bedtime only OD -- d/c while BCL in place             - avoid laying flat on back              - eye shield at all times             - post op drop and positioning instructions reviewed              - tylenol/ibuprofen for pain  - f/u Wednesday, November 11  3,4. Hypertensive retinopathy OU  - discussed importance of tight BP control  - monitor  5. Mixed form age related cataract OU  - The symptoms of cataract, surgical options, and treatments and risks were discussed with patient.  - discussed diagnosis and progression  - not yet visually significant  - monitor for now   Ophthalmic Meds Ordered this visit:  No orders of the defined types were placed in this encounter.      Return in about 5 days (around 01/25/2019) for f/u K check OD.  There are no Patient Instructions on file for this visit.   Explained the diagnoses, plan, and follow up with the patient and they expressed understanding.  Patient expressed understanding of the importance of proper follow up care.    This document serves as a record of services personally performed by Gardiner Sleeper, MD, PhD. It was created on their behalf by Ernest Mallick, OA, an ophthalmic assistant. The creation of this record is the provider's  dictation and/or activities during the visit.    Electronically signed by: Ernest Mallick, OA 11.04.2020 12:55 AM  Gardiner Sleeper, M.D., Ph.D. Diseases & Surgery of the Retina and Vitreous [facility]  I have reviewed the above documentation for accuracy and completeness, and I agree with the above. Gardiner Sleeper, M.D., Ph.D. 01/21/19 12:55 AM   Abbreviations: M myopia (nearsighted); A astigmatism; H hyperopia (farsighted); P presbyopia; Mrx spectacle prescription;  CTL contact lenses; OD right eye; OS left eye; OU both eyes  XT exotropia; ET esotropia; PEK punctate epithelial keratitis; PEE punctate epithelial erosions; DES dry eye syndrome; MGD meibomian gland dysfunction; ATs artificial tears; PFAT's preservative free artificial tears; [place] nuclear sclerotic cataract; PSC posterior subcapsular cataract; ERM epi-retinal membrane; PVD posterior vitreous detachment; RD retinal detachment; DM diabetes mellitus; DR diabetic retinopathy; NPDR non-proliferative diabetic retinopathy; PDR proliferative diabetic retinopathy; CSME clinically significant macular edema; DME diabetic macular edema; dbh dot blot hemorrhages; CWS cotton wool spot; POAG primary open angle glaucoma; C/D cup-to-disc ratio; HVF humphrey visual field; GVF goldmann visual field; OCT optical coherence tomography; IOP intraocular pressure; BRVO Branch retinal vein occlusion; CRVO central retinal vein occlusion; CRAO central retinal artery occlusion; BRAO branch retinal artery occlusion;  RT retinal tear; SB scleral buckle; PPV pars plana vitrectomy; VH Vitreous hemorrhage; PRP panretinal laser photocoagulation; IVK intravitreal kenalog; VMT vitreomacular traction; MH Macular hole;  NVD neovascularization of the disc; NVE neovascularization elsewhere; AREDS age related eye disease study; ARMD age related macular degeneration; POAG primary open angle glaucoma; EBMD epithelial/anterior basement membrane dystrophy; ACIOL  anterior chamber intraocular lens; IOL intraocular lens; PCIOL posterior chamber intraocular lens; Phaco/IOL phacoemulsification with intraocular lens placement; [place] photorefractive keratectomy; LASIK laser assisted in situ keratomileusis; HTN hypertension; DM diabetes mellitus; COPD chronic obstructive pulmonary disease

## 2019-01-20 ENCOUNTER — Ambulatory Visit (INDEPENDENT_AMBULATORY_CARE_PROVIDER_SITE_OTHER): Payer: Medicare HMO | Admitting: Ophthalmology

## 2019-01-20 ENCOUNTER — Other Ambulatory Visit: Payer: Self-pay

## 2019-01-20 DIAGNOSIS — H35033 Hypertensive retinopathy, bilateral: Secondary | ICD-10-CM

## 2019-01-20 DIAGNOSIS — I10 Essential (primary) hypertension: Secondary | ICD-10-CM

## 2019-01-20 DIAGNOSIS — H3581 Retinal edema: Secondary | ICD-10-CM

## 2019-01-20 DIAGNOSIS — H25813 Combined forms of age-related cataract, bilateral: Secondary | ICD-10-CM

## 2019-01-20 DIAGNOSIS — H35341 Macular cyst, hole, or pseudohole, right eye: Secondary | ICD-10-CM

## 2019-01-21 ENCOUNTER — Encounter (INDEPENDENT_AMBULATORY_CARE_PROVIDER_SITE_OTHER): Payer: Self-pay | Admitting: Ophthalmology

## 2019-01-23 NOTE — Progress Notes (Signed)
[facility] Note  01/25/2019     CHIEF COMPLAINT Patient presents for Retina Follow Up   HISTORY OF PRESENT ILLNESS: Roy Young is a 63 y.o. male who presents to the clinic today for:   HPI    Retina Follow Up    Patient presents with  Other (S/P PPV OD for macular hole).  In right eye.  Severity is moderate.  Duration of 5 days.  Since onset it is gradually improving.  I, the attending physician,  performed the HPI with the patient and updated documentation appropriately.          Comments    Patient states vision improving some OD. Using PF and zymaxid every 2 hours, brimonidine, polytrim ointment, and red top gtt once daily OD. No eye pain.       Last edited by [name], MD on 01/25/2019  8:24 AM. (History)    pts caregiver states she is using PF and zymaxid every 2 hours, brimonidine once a day and PSO Ung 4-5 times a day, she states the BCL fell out on Saturday   Referring physician: [name], [name], [place],  [place] [address]  HISTORICAL INFORMATION:   Selected notes from the MEDICAL RECORD NUMBER Referred by Dr. [name] for concern of ERM with mac hole OD   CURRENT MEDICATIONS: Current Outpatient Medications (Ophthalmic Drugs)  Medication Sig  . atropine 1 % ophthalmic solution Place 1 drop into the right eye daily.   . brimonidine (ALPHAGAN) 0.2 % ophthalmic solution Place 1 drop into the right eye daily.  Marland Kitchen gatifloxacin (ZYMAXID) 0.5 % SOLN Place 1 drop into the right eye 4 (four) times daily. (Patient taking differently: Place 1 drop into the right eye every 2 (two) hours while awake. )  . neomycin-bacitracin-polymyxin (NEOSPORIN) ophthalmic ointment Place 1 application into the right eye every 2 (two) hours. If contact lens come out, use every 2 hours  . prednisoLONE acetate (PRED FORTE) 1 % ophthalmic suspension Place 1 drop into the right eye every 2 (two) hours while awake.   No current  facility-administered medications for this visit.  (Ophthalmic Drugs)   Current Outpatient Medications (Other)  Medication Sig  . carbamazepine (CARBATROL) 300 MG 12 hr capsule Take 300-600 mg by mouth See admin instructions. Take 300 mg in the morning and 600 mg in the evening  . divalproex (DEPAKOTE ER) 250 MG 24 hr tablet Take 250-750 mg by mouth See admin instructions. Take 500 mg in the morning, 750 mg Mid-day and in the evening  . ergocalciferol (VITAMIN D2) 50000 units capsule Take 50,000 Units by mouth once a week.  . Lacosamide (VIMPAT) 100 MG TABS Take 100 mg by mouth 2 (two) times daily.  Marland Kitchen levETIRAcetam (KEPPRA) 500 MG tablet Take 500 mg by mouth 2 (two) times daily.  Marland Kitchen loratadine (CLARITIN) 10 MG tablet Take 10 mg by mouth daily.  Marland Kitchen lovastatin (MEVACOR) 40 MG tablet Take 80 mg by mouth every evening.   . Multiple Vitamin (MULTI-VITAMIN) tablet Take 1 tablet by mouth daily. Chewable  . Multiple Vitamins-Minerals (MULTIVITAMIN ADULT PO) Take by mouth.  . propranolol (INDERAL) 10 MG tablet Take 10 mg by mouth 2 (two) times daily.   . sertraline (ZOLOFT) 100 MG tablet Take 100 mg by mouth at bedtime.    No current facility-administered medications for this visit.  (Other)      REVIEW OF SYSTEMS: ROS    Positive for: Neurological,  Eyes, Respiratory   Negative for: Constitutional, Gastrointestinal, Skin, Genitourinary, Musculoskeletal, HENT, Endocrine, Cardiovascular, Psychiatric, Allergic/Imm, Heme/Lymph   Last edited by [name], COT on 01/25/2019  7:58 AM. (History)       ALLERGIES No Known Allergies  PAST MEDICAL HISTORY Past Medical History:  Diagnosis Date  . Developmental delay   . Diabetes mellitus without complication ([place])    not on medication; diet-controlled  . Seizures ([place])   . Sleep apnea    Past Surgical History:  Procedure Laterality Date  . [place] VITRECTOMY WITH 20 GAUGE MVR PORT FOR MACULAR HOLE Right 12/29/2018   Procedure:  25 GAUGE PARS PLANA VITRECTOMY;  Surgeon: [name], MD;  Location: [facility];  Service: Ophthalmology;  Laterality: Right;    FAMILY HISTORY History reviewed. No pertinent family history.  SOCIAL HISTORY Social History   Tobacco Use  . Smoking status: Never Smoker  . Smokeless tobacco: Never Used  Substance Use Topics  . Alcohol use: No    Alcohol/week: 0.0 standard drinks  . Drug use: Never         OPHTHALMIC EXAM:  Base Eye Exam    Visual Acuity (Snellen - Linear)      Right Left   Dist [place] CF at 3' 20/50   Dist ph [place] 20/800 NI       Tonometry (Tonopen, 8:13 AM)      Right Left   Pressure 11 23       Pupils      Dark Light Shape React APD   Right 6 6 Dilated None None   Left 4 3 Round Minimal None       Visual Fields   unable       Extraocular Movement      Right Left    Full, Ortho Full, Ortho       Neuro/Psych    Oriented x3: Yes   Mood/Affect: Normal       Dilation    Both eyes: 1.0% Mydriacyl, 2.5% Phenylephrine @ 8:13 AM        Slit Lamp and Fundus Exam    Slit Lamp Exam      Right Left   Lids/Lashes Dermatochalasis - upper lid, mild Meibomian gland dysfunction Dermatochalasis - upper lid, mild Meibomian gland dysfunction   Conjunctiva/Sclera sutures intact, trace Injection White and quiet, mild melanosis   Cornea New BCL placed; 2.5x2.5 epi defect with rolled edges, 3+corneal haze, 3-4+ Punctate epithelial erosions, 1+ Descemet's folds trace Punctate epithelial erosions   Anterior Chamber Deep, 2+cell Deep and quiet   Iris Round and dilated Round and dilated   Lens 3+ Nuclear sclerosis with early brunescence, 2+ Cortical cataract 2-3+ Nuclear sclerosis with early brunescence, 2+ Cortical cataract   Vitreous post vitrectomy, gas bubble ~45% Mild Vitreous syneresis       Fundus Exam      Right Left   Disc Pink and Sharp, Peripapillary atrophy Pink and Sharp   C/D Ratio 0.5 0.5   Macula Flat, mac hole closing Flat, Good foveal reflex,  Retinal pigment epithelial mottling, No heme or edema   Vessels Vascular attenuation, Copper wiring, AV crossing changes Vascular attenuation, Copper wiring, AV crossing changes   Periphery Attached, No heme  Attached, No heme           IMAGING AND PROCEDURES  Imaging and Procedures for _0 @           ASSESSMENT/PLAN:    ICD-10-CM   1. Macular  hole of right eye  H35.341   2. Retinal edema  H35.81 CANCELED: OCT, Retina - OU - Both Eyes  3. Essential hypertension  I10   4. Hypertensive retinopathy of both eyes  H35.033   5. Combined forms of age-related cataract of both eyes  H25.813     1,2. Full thickness mac hole OD  - pt with mental retardation and lives in a group home of 6 residents and 3 care givers  - s/p PPV/ICG/MP/14% C3F8 OD, 10.15.20             - here for K check             - retina attached and good gas bubble in place w/ mac hole closing  - today, epi defect 2.5x2.5 from 1.5x2.75 last Friday -- though BCL was not in place  - new BCL placed today [employer], N&D, BC: 8.4, DIA: 13.8)             - IOP 11  - gas bubble ~45%  - AC with improved cell/pigment  - cont gatifloxacin QID OD              - cont PF to QID OD   - cont Brimonidine to QD OD  - PSO ung bedtime only OD -- use every 2 hours if contact lens comes out             - avoid laying flat on back              - eye shield at all times             - post op drop and positioning instructions reviewed              - tylenol/ibuprofen for pain  - f/u Monday, November 16, K check -- if not healed, refer to cornea specialist  3,4. Hypertensive retinopathy OU  - discussed importance of tight BP control  - monitor  5. Mixed form age related cataract OU  - The symptoms of cataract, surgical options, and treatments and risks were discussed with patient.  - discussed diagnosis and progression  - not yet visually significant  - monitor for now   Ophthalmic Meds Ordered this visit:  Meds  ordered this encounter  Medications  . neomycin-bacitracin-polymyxin (NEOSPORIN) ophthalmic ointment    Sig: Place 1 application into the right eye every 2 (two) hours. If contact lens come out, use every 2 hours    Dispense:  3.5 g    Refill:  3       Return in about 5 days (around 01/30/2019) for f/u K check OD.  There are no Patient Instructions on file for this visit.   Explained the diagnoses, plan, and follow up with the patient and they expressed understanding.  Patient expressed understanding of the importance of proper follow up care.   This document serves as a record of services personally performed by Gardiner Sleeper, MD, PhD. It was created on their behalf by Roselee Nova, COMT. The creation of this record is the provider's dictation and/or activities during the visit.  Electronically signed by: Roselee Nova, COMT 01/29/19 9:50 PM   This document serves as a record of services personally performed by Gardiner Sleeper, MD, PhD. It was created on their behalf by Ernest Mallick, OA, an ophthalmic assistant. The creation of this record is the provider's dictation and/or activities during the visit.    Electronically signed by:  Ernest Mallick, [place] 11.11.2020 9:50 PM   Gardiner Sleeper, M.D., Ph.D. Diseases & Surgery of the Retina and Vitreous  [facility]   I have reviewed the above documentation for accuracy and completeness, and I agree with the above. Gardiner Sleeper, M.D., Ph.D. 01/29/19 9:50 PM    Abbreviations: M myopia (nearsighted); A astigmatism; H hyperopia (farsighted); P presbyopia; Mrx spectacle prescription;  CTL contact lenses; OD right eye; OS left eye; OU both eyes  XT exotropia; ET esotropia; PEK punctate epithelial keratitis; PEE punctate epithelial erosions; DES dry eye syndrome; MGD meibomian gland dysfunction; ATs artificial tears; PFAT's preservative free artificial tears; [place] nuclear sclerotic cataract; PSC posterior subcapsular  cataract; ERM epi-retinal membrane; PVD posterior vitreous detachment; RD retinal detachment; DM diabetes mellitus; DR diabetic retinopathy; NPDR non-proliferative diabetic retinopathy; PDR proliferative diabetic retinopathy; CSME clinically significant macular edema; DME diabetic macular edema; dbh dot blot hemorrhages; CWS cotton wool spot; POAG primary open angle glaucoma; C/D cup-to-disc ratio; HVF humphrey visual field; GVF goldmann visual field; OCT optical coherence tomography; IOP intraocular pressure; BRVO Branch retinal vein occlusion; CRVO central retinal vein occlusion; CRAO central retinal artery occlusion; BRAO branch retinal artery occlusion; RT retinal tear; SB scleral buckle; PPV pars plana vitrectomy; VH Vitreous hemorrhage; PRP panretinal laser photocoagulation; IVK intravitreal kenalog; VMT vitreomacular traction; MH Macular hole;  NVD neovascularization of the disc; NVE neovascularization elsewhere; AREDS age related eye disease study; ARMD age related macular degeneration; POAG primary open angle glaucoma; EBMD epithelial/anterior basement membrane dystrophy; ACIOL anterior chamber intraocular lens; IOL intraocular lens; PCIOL posterior chamber intraocular lens; Phaco/IOL phacoemulsification with intraocular lens placement; [place] photorefractive keratectomy; LASIK laser assisted in situ keratomileusis; HTN hypertension; DM diabetes mellitus; COPD chronic obstructive pulmonary disease

## 2019-01-25 ENCOUNTER — Encounter (INDEPENDENT_AMBULATORY_CARE_PROVIDER_SITE_OTHER): Payer: Self-pay | Admitting: Ophthalmology

## 2019-01-25 ENCOUNTER — Ambulatory Visit (INDEPENDENT_AMBULATORY_CARE_PROVIDER_SITE_OTHER): Payer: Medicare HMO | Admitting: Ophthalmology

## 2019-01-25 DIAGNOSIS — H35033 Hypertensive retinopathy, bilateral: Secondary | ICD-10-CM

## 2019-01-25 DIAGNOSIS — H25813 Combined forms of age-related cataract, bilateral: Secondary | ICD-10-CM

## 2019-01-25 DIAGNOSIS — H3581 Retinal edema: Secondary | ICD-10-CM

## 2019-01-25 DIAGNOSIS — H35341 Macular cyst, hole, or pseudohole, right eye: Secondary | ICD-10-CM

## 2019-01-25 DIAGNOSIS — I10 Essential (primary) hypertension: Secondary | ICD-10-CM

## 2019-01-25 MED ORDER — NEOMYCIN-BACITRACIN ZN-POLYMYX 5-400-10000 OP OINT: 1.0000 "application " | TOPICAL_OINTMENT | OPHTHALMIC | 3 refills | Status: DC

## 2019-01-26 NOTE — Progress Notes (Signed)
[facility] Note  01/30/2019     CHIEF COMPLAINT Patient presents for Post-op Follow-up   HISTORY OF PRESENT ILLNESS: Roy Young is a 63 y.o. male who presents to the clinic today for:   HPI    Post-op Follow-up    In right eye.  Discomfort includes none.  I, the attending physician,  performed the HPI with the patient and updated documentation appropriately.          Comments    Patient's caregiver states there is no complaint of decrease in vision or pain.       Last edited by [name], MD on 01/30/2019 11:35 PM. (History)    pts caregiver states that the pt has not been complaining of pain or discomfort, she states she believes the contact lens is still in place and they have been using the drops as directed  Referring physician: [name], [name], [place],  [place] [address]  HISTORICAL INFORMATION:   Selected notes from the [facility] Referred by Dr. [name] for concern of ERM with mac hole OD   CURRENT MEDICATIONS: Current Outpatient Medications (Ophthalmic Drugs)  Medication Sig  . atropine 1 % ophthalmic solution Place 1 drop into the right eye daily.   . brimonidine (ALPHAGAN) 0.2 % ophthalmic solution Place 1 drop into the right eye daily.  Marland Kitchen gatifloxacin (ZYMAXID) 0.5 % SOLN Place 1 drop into the right eye 4 (four) times daily.  Marland Kitchen neomycin-bacitracin-polymyxin (NEOSPORIN) ophthalmic ointment Place 1 application into the right eye every 2 (two) hours. If contact lens come out, use every 2 hours  . prednisoLONE acetate (PRED FORTE) 1 % ophthalmic suspension Place 1 drop into the right eye every 2 (two) hours while awake.   No current facility-administered medications for this visit.  (Ophthalmic Drugs)   Current Outpatient Medications (Other)  Medication Sig  . carbamazepine (CARBATROL) 300 MG 12 hr capsule Take 300-600 mg by mouth See admin instructions. Take 300 mg in the morning and  600 mg in the evening  . divalproex (DEPAKOTE ER) 250 MG 24 hr tablet Take 250-750 mg by mouth See admin instructions. Take 500 mg in the morning, 750 mg Mid-day and in the evening  . ergocalciferol (VITAMIN D2) 50000 units capsule Take 50,000 Units by mouth once a week.  . Lacosamide (VIMPAT) 100 MG TABS Take 100 mg by mouth 2 (two) times daily.  Marland Kitchen levETIRAcetam (KEPPRA) 500 MG tablet Take 500 mg by mouth 2 (two) times daily.  Marland Kitchen loratadine (CLARITIN) 10 MG tablet Take 10 mg by mouth daily.  Marland Kitchen lovastatin (MEVACOR) 40 MG tablet Take 80 mg by mouth every evening.   . Multiple Vitamin (MULTI-VITAMIN) tablet Take 1 tablet by mouth daily. Chewable  . Multiple Vitamins-Minerals (MULTIVITAMIN ADULT PO) Take by mouth.  . propranolol (INDERAL) 10 MG tablet Take 10 mg by mouth 2 (two) times daily.   . sertraline (ZOLOFT) 100 MG tablet Take 100 mg by mouth at bedtime.    No current facility-administered medications for this visit.  (Other)      REVIEW OF SYSTEMS: ROS    Positive for: Neurological, Eyes, Respiratory   Negative for: Constitutional, Gastrointestinal, Skin, Genitourinary, Musculoskeletal, HENT, Endocrine, Cardiovascular, Psychiatric, Allergic/Imm, Heme/Lymph   Last edited by Doneen Poisson on 01/30/2019  2:27 PM. (History)       ALLERGIES No Known Allergies  PAST MEDICAL HISTORY Past Medical History:  Diagnosis Date  . Developmental delay   .  Diabetes mellitus without complication ([place])    not on medication; diet-controlled  . Seizures ([place])   . Sleep apnea    Past Surgical History:  Procedure Laterality Date  . [place] VITRECTOMY WITH 20 GAUGE MVR PORT FOR MACULAR HOLE Right 12/29/2018   Procedure: 25 GAUGE PARS PLANA VITRECTOMY;  Surgeon: [name], MD;  Location: [facility];  Service: Ophthalmology;  Laterality: Right;    FAMILY HISTORY History reviewed. No pertinent family history.  SOCIAL HISTORY Social History   Tobacco Use  . Smoking status:  Never Smoker  . Smokeless tobacco: Never Used  Substance Use Topics  . Alcohol use: No    Alcohol/week: 0.0 standard drinks  . Drug use: Never         OPHTHALMIC EXAM:  Base Eye Exam    Visual Acuity (Snellen - Linear)      Right Left   Dist [place] CF @ 3' 20/50 -2   Dist ph  NI NI  Contact lens in place OD       Tonometry (Tonopen, 2:31 PM)      Right Left   Pressure 11 def  Contact lens in place OD       Pupils      Dark Light Shape React APD   Right 7 6 Round Minimal 0   Left 4 3 Round Minimal 0       Extraocular Movement      Right Left    Full Full       Neuro/Psych    Oriented x3: Yes   Mood/Affect: Normal       Dilation    Right eye: 1.0% Mydriacyl, 2.5% Phenylephrine @ 2:31 PM        Slit Lamp and Fundus Exam    Slit Lamp Exam      Right Left   Lids/Lashes Dermatochalasis - upper lid, mild Meibomian gland dysfunction Dermatochalasis - upper lid, mild Meibomian gland dysfunction   Conjunctiva/Sclera sutures intact, trace Injection White and quiet, mild melanosis   Cornea BCL in place; 2.5Vx4.5H healing epi defect with 2+ Descemet's folds within, 2+corneal haze, 2+ Punctate epithelial erosions; rolled edges healing trace Punctate epithelial erosions   Anterior Chamber Deep, 2+cell Deep and quiet   Iris Round and dilated Round and dilated   Lens 3+ Nuclear sclerosis with early brunescence, 2+ Cortical cataract 2-3+ Nuclear sclerosis with early brunescence, 2+ Cortical cataract   Vitreous post vitrectomy, gas bubble ~45% Mild Vitreous syneresis       Fundus Exam      Right Left   Disc Pink and Sharp, Peripapillary atrophy Pink and Sharp   C/D Ratio 0.5 0.5   Macula Flat, mac hole closing Flat, Good foveal reflex, Retinal pigment epithelial mottling, No heme or edema   Vessels Vascular attenuation, Copper wiring, AV crossing changes Vascular attenuation, Copper wiring, AV crossing changes   Periphery Attached, No heme  Attached, No heme          Refraction    Wearing Rx      Sphere Cylinder Axis Add   Right -0.25 +1.25 013 +2.25   Left -0.75 +1.00 170 +2.25   Type: Bifocal  Not wearing          IMAGING AND PROCEDURES  Imaging and Procedures for _0 @  OCT, Retina - OU - Both Eyes       Right Eye Quality was poor.   Left Eye Quality was good. Central Foveal Thickness: 244. Progression has  been stable. Findings include normal foveal contour, no IRF, no SRF, vitreomacular adhesion .   Notes *Images captured and stored on drive  Diagnosis / Impression:  OD: no image OS: NFP, no IRF/SRF, VMA  Clinical management:  See below  Abbreviations: NFP - Normal foveal profile. CME - cystoid macular edema. PED - pigment epithelial detachment. IRF - intraretinal fluid. SRF - subretinal fluid. EZ - ellipsoid zone. ERM - epiretinal membrane. ORA - outer retinal atrophy. ORT - outer retinal tubulation. SRHM - subretinal hyper-reflective material                 ASSESSMENT/PLAN:    ICD-10-CM   1. Macular hole of right eye  H35.341   2. Retinal edema  H35.81 OCT, Retina - OU - Both Eyes  3. Essential hypertension  I10   4. Hypertensive retinopathy of both eyes  H35.033   5. Combined forms of age-related cataract of both eyes  H25.813     1,2. Full thickness mac hole OD  - pt with mental retardation and lives in a group home of 6 residents and 3 care givers  - s/p PPV/ICG/MP/14% C3F8 OD, 10.15.20             - here for K check             - retina attached and good gas bubble in place w/ mac hole closing  - today, epi defect healing, but larger in fluorescein staining -- though BCL remains in place  - BCL placed 11.11.20 ([employer], N&D, BC: 8.4, DIA: 13.8)             - IOP 11  - gas bubble ~45%  - AC with improved cell/pigment  - cont gatifloxacin QID OD              - cont PF to QID OD -- reduce to BID OD  - cont Brimonidine to QD OD -- okay to stop  - PSO ung bedtime only OD -- d/c while BCL in  place             - avoid laying flat on back              - eye shield at all times             - post op drop and positioning instructions reviewed              - tylenol/ibuprofen for pain  - f/u 4 days for repeat K check  3,4. Hypertensive retinopathy OU  - discussed importance of tight BP control  - monitor  5. Mixed form age related cataract OU  - The symptoms of cataract, surgical options, and treatments and risks were discussed with patient.  - discussed diagnosis and progression  - not yet visually significant  - monitor for now   Ophthalmic Meds Ordered this visit:  Meds ordered this encounter  Medications  . gatifloxacin (ZYMAXID) 0.5 % SOLN    Sig: Place 1 drop into the right eye 4 (four) times daily.    Dispense:  2.5 mL    Refill:  4       Return in about 4 days (around 02/03/2019) for f/u [place] OD, DFE, OCT - overbook okay.  There are no Patient Instructions on file for this visit.   Explained the diagnoses, plan, and follow up with the patient and they expressed understanding.  Patient expressed understanding of the importance of proper  follow up care.   This document serves as a record of services personally performed by Gardiner Sleeper, MD, PhD. It was created on their behalf by Leeann Must, [place], a certified ophthalmic assistant. The creation of this record is the provider's dictation and/or activities during the visit.    Electronically signed by: Leeann Must, COA _0 @ 11:40 PM   Gardiner Sleeper, M.D., Ph.D. Diseases & Surgery of the Retina and Vitreous [facility]  I have reviewed the above documentation for accuracy and completeness, and I agree with the above. Gardiner Sleeper, M.D., Ph.D. 01/30/19 11:40 PM    Abbreviations: M myopia (nearsighted); A astigmatism; H hyperopia (farsighted); P presbyopia; Mrx spectacle prescription;  CTL contact lenses; OD right eye; OS left eye; OU both eyes  XT exotropia; ET  esotropia; PEK punctate epithelial keratitis; PEE punctate epithelial erosions; DES dry eye syndrome; MGD meibomian gland dysfunction; ATs artificial tears; PFAT's preservative free artificial tears; [place] nuclear sclerotic cataract; PSC posterior subcapsular cataract; ERM epi-retinal membrane; PVD posterior vitreous detachment; RD retinal detachment; DM diabetes mellitus; DR diabetic retinopathy; NPDR non-proliferative diabetic retinopathy; PDR proliferative diabetic retinopathy; CSME clinically significant macular edema; DME diabetic macular edema; dbh dot blot hemorrhages; CWS cotton wool spot; POAG primary open angle glaucoma; C/D cup-to-disc ratio; HVF humphrey visual field; GVF goldmann visual field; OCT optical coherence tomography; IOP intraocular pressure; BRVO Branch retinal vein occlusion; CRVO central retinal vein occlusion; CRAO central retinal artery occlusion; BRAO branch retinal artery occlusion; RT retinal tear; SB scleral buckle; PPV pars plana vitrectomy; VH Vitreous hemorrhage; PRP panretinal laser photocoagulation; IVK intravitreal kenalog; VMT vitreomacular traction; MH Macular hole;  NVD neovascularization of the disc; NVE neovascularization elsewhere; AREDS age related eye disease study; ARMD age related macular degeneration; POAG primary open angle glaucoma; EBMD epithelial/anterior basement membrane dystrophy; ACIOL anterior chamber intraocular lens; IOL intraocular lens; PCIOL posterior chamber intraocular lens; Phaco/IOL phacoemulsification with intraocular lens placement; [place] photorefractive keratectomy; LASIK laser assisted in situ keratomileusis; HTN hypertension; DM diabetes mellitus; COPD chronic obstructive pulmonary disease

## 2019-01-30 ENCOUNTER — Ambulatory Visit (INDEPENDENT_AMBULATORY_CARE_PROVIDER_SITE_OTHER): Payer: Medicare HMO | Admitting: Ophthalmology

## 2019-01-30 ENCOUNTER — Encounter (INDEPENDENT_AMBULATORY_CARE_PROVIDER_SITE_OTHER): Payer: Self-pay | Admitting: Ophthalmology

## 2019-01-30 DIAGNOSIS — H3581 Retinal edema: Secondary | ICD-10-CM

## 2019-01-30 DIAGNOSIS — H35033 Hypertensive retinopathy, bilateral: Secondary | ICD-10-CM

## 2019-01-30 DIAGNOSIS — H35341 Macular cyst, hole, or pseudohole, right eye: Secondary | ICD-10-CM

## 2019-01-30 DIAGNOSIS — H25813 Combined forms of age-related cataract, bilateral: Secondary | ICD-10-CM

## 2019-01-30 DIAGNOSIS — I10 Essential (primary) hypertension: Secondary | ICD-10-CM

## 2019-01-30 MED ORDER — GATIFLOXACIN 0.5 % OP SOLN: 1.0000 [drp] | Freq: Four times a day (QID) | OPHTHALMIC | 4 refills | Status: DC

## 2019-02-01 NOTE — Progress Notes (Signed)
[facility] Note  02/03/2019     CHIEF COMPLAINT Patient presents for Retina Follow Up   HISTORY OF PRESENT ILLNESS: Roy Young is a 63 y.o. male who presents to the clinic today for:   HPI    Retina Follow Up    Patient presents with  Other.  In right eye.  This started 4 days ago.  Severity is moderate.  I, the attending physician,  performed the HPI with the patient and updated documentation appropriately.          Comments    Patient here for 4 days retina follow up for [place] OD. Patient states vision seems better. Feels better. Cant tell where the bubble is. No eye pain.        Last edited by [name], MD on 02/03/2019  9:59 AM. (History)    pts caregiver states he is doing well, he is not complaining of pain  Referring physician: [name], [name], [place],  [place] [address]  HISTORICAL INFORMATION:   Selected notes from the [place] [facility] Referred by Dr. [name] for concern of ERM with mac hole OD   CURRENT MEDICATIONS: Current Outpatient Medications (Ophthalmic Drugs)  Medication Sig  . atropine 1 % ophthalmic solution Place 1 drop into the right eye daily.   . brimonidine (ALPHAGAN) 0.2 % ophthalmic solution Place 1 drop into the right eye daily.  Marland Kitchen gatifloxacin (ZYMAXID) 0.5 % SOLN Place 1 drop into the right eye 4 (four) times daily.  Marland Kitchen neomycin-bacitracin-polymyxin (NEOSPORIN) ophthalmic ointment Place 1 application into the right eye every 2 (two) hours. If contact lens come out, use every 2 hours  . prednisoLONE acetate (PRED FORTE) 1 % ophthalmic suspension Place 1 drop into the right eye every 2 (two) hours while awake.   No current facility-administered medications for this visit.  (Ophthalmic Drugs)   Current Outpatient Medications (Other)  Medication Sig  . carbamazepine (CARBATROL) 300 MG 12 hr capsule Take 300-600 mg by mouth See admin instructions. Take 300 mg in the morning  and 600 mg in the evening  . divalproex (DEPAKOTE ER) 250 MG 24 hr tablet Take 250-750 mg by mouth See admin instructions. Take 500 mg in the morning, 750 mg Mid-day and in the evening  . ergocalciferol (VITAMIN D2) 50000 units capsule Take 50,000 Units by mouth once a week.  . Lacosamide (VIMPAT) 100 MG TABS Take 100 mg by mouth 2 (two) times daily.  Marland Kitchen levETIRAcetam (KEPPRA) 500 MG tablet Take 500 mg by mouth 2 (two) times daily.  Marland Kitchen loratadine (CLARITIN) 10 MG tablet Take 10 mg by mouth daily.  Marland Kitchen lovastatin (MEVACOR) 40 MG tablet Take 80 mg by mouth every evening.   . Multiple Vitamin (MULTI-VITAMIN) tablet Take 1 tablet by mouth daily. Chewable  . Multiple Vitamins-Minerals (MULTIVITAMIN ADULT PO) Take by mouth.  . propranolol (INDERAL) 10 MG tablet Take 10 mg by mouth 2 (two) times daily.   . sertraline (ZOLOFT) 100 MG tablet Take 100 mg by mouth at bedtime.    No current facility-administered medications for this visit.  (Other)      REVIEW OF SYSTEMS: ROS    Positive for: Neurological, Eyes, Respiratory   Negative for: Constitutional, Gastrointestinal, Skin, Genitourinary, Musculoskeletal, HENT, Endocrine, Cardiovascular, Psychiatric, Allergic/Imm, Heme/Lymph   Last edited by Theodore Demark, COA on 02/03/2019  9:02 AM. (History)       ALLERGIES No Known Allergies  PAST MEDICAL HISTORY  Past Medical History:  Diagnosis Date  . Developmental delay   . Diabetes mellitus without complication ([place])    not on medication; diet-controlled  . Seizures ([place])   . Sleep apnea    Past Surgical History:  Procedure Laterality Date  . [place] VITRECTOMY WITH 20 GAUGE MVR PORT FOR MACULAR HOLE Right 12/29/2018   Procedure: 25 GAUGE PARS PLANA VITRECTOMY;  Surgeon: [name], MD;  Location: [facility];  Service: Ophthalmology;  Laterality: Right;    FAMILY HISTORY History reviewed. No pertinent family history.  SOCIAL HISTORY Social History   Tobacco Use  . Smoking  status: Never Smoker  . Smokeless tobacco: Never Used  Substance Use Topics  . Alcohol use: No    Alcohol/week: 0.0 standard drinks  . Drug use: Never         OPHTHALMIC EXAM:  Base Eye Exam    Visual Acuity (Snellen - Linear)      Right Left   Dist [place] 20/400 -1 20/40   Dist ph [place] NI NI       Tonometry (Tonopen, 8:58 AM)      Right Left   Pressure 12 15       Pupils      Dark Light Shape React APD   Right 7 6 Round Minimal None   Left 4 3 Round Minimal None       Extraocular Movement      Right Left    Full, Ortho Full, Ortho       Neuro/Psych    Oriented x3: Yes   Mood/Affect: Normal       Dilation    Both eyes: 1.0% Mydriacyl, 2.5% Phenylephrine @ 8:58 AM        Slit Lamp and Fundus Exam    Slit Lamp Exam      Right Left   Lids/Lashes Dermatochalasis - upper lid, mild Meibomian gland dysfunction Dermatochalasis - upper lid, mild Meibomian gland dysfunction   Conjunctiva/Sclera sutures intact, trace Injection White and quiet, mild melanosis   Cornea BCL in place; 2.0Vx4.0H healing epi defect with 2+ Descemet's folds within, 2+corneal haze, 2+ Punctate epithelial erosions; rolled edges healing trace Punctate epithelial erosions   Anterior Chamber Deep, 2+cell Deep and quiet   Iris Round and dilated Round and dilated   Lens 3+ Nuclear sclerosis with early brunescence, 2+ Cortical cataract 2-3+ Nuclear sclerosis with early brunescence, 2+ Cortical cataract   Vitreous post vitrectomy, gas bubble ~45% Mild Vitreous syneresis       Fundus Exam      Right Left   Disc Pink and Sharp, Peripapillary atrophy Pink and Sharp   C/D Ratio 0.5 0.5   Macula Flat, mac hole closing Flat, Good foveal reflex, Retinal pigment epithelial mottling, No heme or edema   Vessels Vascular attenuation, Copper wiring, AV crossing changes Vascular attenuation, Copper wiring, AV crossing changes   Periphery Attached, No heme  Attached, No heme           IMAGING AND PROCEDURES   Imaging and Procedures for _0 @           ASSESSMENT/PLAN:    ICD-10-CM   1. Macular hole of right eye  H35.341   2. Retinal edema  H35.81 CANCELED: OCT, Retina - OU - Both Eyes  3. Essential hypertension  I10   4. Hypertensive retinopathy of both eyes  H35.033   5. Combined forms of age-related cataract of both eyes  H25.813     1,2.  Full thickness mac hole OD  - pt with mental retardation and lives in a group home of 6 residents and 3 care givers  - s/p PPV/ICG/MP/14% C3F8 OD, 10.15.20             - here for K check             - retina attached and good gas bubble in place w/ mac hole closing  - BCL placed 11.11.20 ([employer], [employer], N&D, BC: 8.4, DIA: 13.8)  - today, BCL in place and epi defect healing very slowly -- now 2x4 mm defect             - IOP 12  - gas bubble ~45%  - AC with improved cell/pigment  - cont gatifloxacin QID OD              - cont PF BID OD  - PSO ung bedtime only OD -- d/c while BCL in place             - avoid laying flat on back              - eye shield at all times             - post op drop and positioning instructions reviewed              - tylenol/ibuprofen for pain  - f/u 5 days for repeat K check, DFE, OCT  3,4. Hypertensive retinopathy OU  - discussed importance of tight BP control  - monitor  5. Mixed form age related cataract OU  - The symptoms of cataract, surgical options, and treatments and risks were discussed with patient.  - discussed diagnosis and progression  - not yet visually significant  - monitor for now   Ophthalmic Meds Ordered this visit:  No orders of the defined types were placed in this encounter.      Return in about 5 days (around 02/08/2019) for f/u K check OD -- overbook okay.  There are no Patient Instructions on file for this visit.   Explained the diagnoses, plan, and follow up with the patient and they expressed understanding.  Patient expressed understanding of the importance of proper  follow up care.   This document serves as a record of services personally performed by Gardiner Sleeper, MD, PhD. It was created on their behalf by Ernest Mallick, OA, an ophthalmic assistant. The creation of this record is the provider's dictation and/or activities during the visit.    Electronically signed by: Ernest Mallick, OA 11.18.2020 1:04 PM   Gardiner Sleeper, M.D., Ph.D. Diseases & Surgery of the Retina and Vitreous [facility]  I have reviewed the above documentation for accuracy and completeness, and I agree with the above. Gardiner Sleeper, M.D., Ph.D. 02/03/19 1:04 PM   Abbreviations: M myopia (nearsighted); A astigmatism; H hyperopia (farsighted); P presbyopia; Mrx spectacle prescription;  CTL contact lenses; OD right eye; OS left eye; OU both eyes  XT exotropia; ET esotropia; PEK punctate epithelial keratitis; PEE punctate epithelial erosions; DES dry eye syndrome; MGD meibomian gland dysfunction; ATs artificial tears; PFAT's preservative free artificial tears; [place] nuclear sclerotic cataract; PSC posterior subcapsular cataract; ERM epi-retinal membrane; PVD posterior vitreous detachment; RD retinal detachment; DM diabetes mellitus; DR diabetic retinopathy; NPDR non-proliferative diabetic retinopathy; PDR proliferative diabetic retinopathy; CSME clinically significant macular edema; DME diabetic macular edema; dbh dot blot hemorrhages; CWS cotton wool spot; POAG primary open  angle glaucoma; C/D cup-to-disc ratio; HVF humphrey visual field; GVF goldmann visual field; OCT optical coherence tomography; IOP intraocular pressure; BRVO Branch retinal vein occlusion; CRVO central retinal vein occlusion; CRAO central retinal artery occlusion; BRAO branch retinal artery occlusion; RT retinal tear; SB scleral buckle; PPV pars plana vitrectomy; VH Vitreous hemorrhage; PRP panretinal laser photocoagulation; IVK intravitreal kenalog; VMT vitreomacular traction; MH Macular hole;  NVD  neovascularization of the disc; NVE neovascularization elsewhere; AREDS age related eye disease study; ARMD age related macular degeneration; POAG primary open angle glaucoma; EBMD epithelial/anterior basement membrane dystrophy; ACIOL anterior chamber intraocular lens; IOL intraocular lens; PCIOL posterior chamber intraocular lens; Phaco/IOL phacoemulsification with intraocular lens placement; [place] photorefractive keratectomy; LASIK laser assisted in situ keratomileusis; HTN hypertension; DM diabetes mellitus; COPD chronic obstructive pulmonary disease

## 2019-02-03 ENCOUNTER — Encounter (INDEPENDENT_AMBULATORY_CARE_PROVIDER_SITE_OTHER): Payer: Self-pay | Admitting: Ophthalmology

## 2019-02-03 ENCOUNTER — Ambulatory Visit (INDEPENDENT_AMBULATORY_CARE_PROVIDER_SITE_OTHER): Payer: Medicare HMO | Admitting: Ophthalmology

## 2019-02-03 ENCOUNTER — Other Ambulatory Visit: Payer: Self-pay

## 2019-02-03 DIAGNOSIS — I10 Essential (primary) hypertension: Secondary | ICD-10-CM

## 2019-02-03 DIAGNOSIS — H25813 Combined forms of age-related cataract, bilateral: Secondary | ICD-10-CM

## 2019-02-03 DIAGNOSIS — H35033 Hypertensive retinopathy, bilateral: Secondary | ICD-10-CM

## 2019-02-03 DIAGNOSIS — H3581 Retinal edema: Secondary | ICD-10-CM

## 2019-02-03 DIAGNOSIS — H35341 Macular cyst, hole, or pseudohole, right eye: Secondary | ICD-10-CM

## 2019-02-06 NOTE — Progress Notes (Signed)
[facility] Note  02/08/2019     CHIEF COMPLAINT Patient presents for Post-op Follow-up   HISTORY OF PRESENT ILLNESS: Roy Young is a 63 y.o. male who presents to the clinic today for:   HPI    Post-op Follow-up    In right eye.  Discomfort includes none.  I, the attending physician,  performed the HPI with the patient and updated documentation appropriately.          Comments    Patient's caregiver states there has not been any complaints of pain, discomfort, or change in vision.       Last edited by [name], MD on 02/08/2019  8:40 AM. (History)    pts caregiver states she has been using PF BID and gati QID as directed  Referring physician: [name], [name], [place],  [place] [address]  HISTORICAL INFORMATION:   Selected notes from the [facility] Referred by Dr. [name] for concern of ERM with mac hole OD   CURRENT MEDICATIONS: Current Outpatient Medications (Ophthalmic Drugs)  Medication Sig  . atropine 1 % ophthalmic solution Place 1 drop into the right eye daily.   . brimonidine (ALPHAGAN) 0.2 % ophthalmic solution Place 1 drop into the right eye daily.  Marland Kitchen gatifloxacin (ZYMAXID) 0.5 % SOLN Place 1 drop into the right eye 4 (four) times daily.  Marland Kitchen neomycin-bacitracin-polymyxin (NEOSPORIN) ophthalmic ointment Place 1 application into the right eye every 2 (two) hours. If contact lens come out, use every 2 hours  . prednisoLONE acetate (PRED FORTE) 1 % ophthalmic suspension Place 1 drop into the right eye every 2 (two) hours while awake.   No current facility-administered medications for this visit.  (Ophthalmic Drugs)   Current Outpatient Medications (Other)  Medication Sig  . carbamazepine (CARBATROL) 300 MG 12 hr capsule Take 300-600 mg by mouth See admin instructions. Take 300 mg in the morning and 600 mg in the evening  . divalproex (DEPAKOTE ER) 250 MG 24 hr tablet Take 250-750 mg by  mouth See admin instructions. Take 500 mg in the morning, 750 mg Mid-day and in the evening  . ergocalciferol (VITAMIN D2) 50000 units capsule Take 50,000 Units by mouth once a week.  . Lacosamide (VIMPAT) 100 MG TABS Take 100 mg by mouth 2 (two) times daily.  Marland Kitchen levETIRAcetam (KEPPRA) 500 MG tablet Take 500 mg by mouth 2 (two) times daily.  Marland Kitchen loratadine (CLARITIN) 10 MG tablet Take 10 mg by mouth daily.  Marland Kitchen lovastatin (MEVACOR) 40 MG tablet Take 80 mg by mouth every evening.   . Multiple Vitamin (MULTI-VITAMIN) tablet Take 1 tablet by mouth daily. Chewable  . Multiple Vitamins-Minerals (MULTIVITAMIN ADULT PO) Take by mouth.  . propranolol (INDERAL) 10 MG tablet Take 10 mg by mouth 2 (two) times daily.   . sertraline (ZOLOFT) 100 MG tablet Take 100 mg by mouth at bedtime.   . valACYclovir (VALTREX) 1000 MG tablet Take 1 tablet (1,000 mg total) by mouth 3 (three) times daily.   No current facility-administered medications for this visit.  (Other)      REVIEW OF SYSTEMS: ROS    Positive for: Neurological, Eyes, Respiratory   Negative for: Constitutional, Gastrointestinal, Skin, Genitourinary, Musculoskeletal, HENT, Endocrine, Cardiovascular, Psychiatric, Allergic/Imm, Heme/Lymph   Last edited by Doneen Poisson on 02/08/2019  7:44 AM. (History)       ALLERGIES No Known Allergies  PAST MEDICAL HISTORY Past Medical History:  Diagnosis Date  .  Developmental delay   . Diabetes mellitus without complication ([place])    not on medication; diet-controlled  . Seizures ([place])   . Sleep apnea    Past Surgical History:  Procedure Laterality Date  . [place] VITRECTOMY WITH 20 GAUGE MVR PORT FOR MACULAR HOLE Right 12/29/2018   Procedure: 25 GAUGE PARS PLANA VITRECTOMY;  Surgeon: [name], MD;  Location: [facility];  Service: Ophthalmology;  Laterality: Right;    FAMILY HISTORY History reviewed. No pertinent family history.  SOCIAL HISTORY Social History   Tobacco Use  .  Smoking status: Never Smoker  . Smokeless tobacco: Never Used  Substance Use Topics  . Alcohol use: No    Alcohol/week: 0.0 standard drinks  . Drug use: Never         OPHTHALMIC EXAM:  Base Eye Exam    Visual Acuity (Snellen - Linear)      Right Left   Dist [place] CF @ 2' 20/25 -2   Dist ph [place] 20/400 NI       Tonometry (Tonopen, 7:47 AM)      Right Left   Pressure 12 def       Pupils      Dark Light Shape React APD   Right Hazy       Left 3 2 Round Brisk 0       Extraocular Movement      Right Left    Full Full       Neuro/Psych    Oriented x3: Yes   Mood/Affect: Normal       Dilation    Both eyes: 1.0% Mydriacyl, 2.5% Phenylephrine @ 7:48 AM        Slit Lamp and Fundus Exam    Slit Lamp Exam      Right Left   Lids/Lashes Dermatochalasis - upper lid, mild Meibomian gland dysfunction Dermatochalasis - upper lid, mild Meibomian gland dysfunction   Conjunctiva/Sclera sutures intact - disolving, trace Injection White and quiet, mild melanosis   Cornea BCL in place with a lot of debris; 3.0Vx5.0H healing epi defect with 3+ Descemet's folds within, mild corneal haze, 1+ Punctate epithelial erosions; rolled edges healing trace Punctate epithelial erosions   Anterior Chamber Deep, 2+cell Deep and quiet   Iris Round and dilated Round and dilated   Lens 3+ Nuclear sclerosis with early brunescence, 2+ Cortical cataract 2-3+ Nuclear sclerosis with early brunescence, 2+ Cortical cataract   Vitreous post vitrectomy, gas bubble ~35-40% Mild Vitreous syneresis       Fundus Exam      Right Left   Disc Pink and Sharp, Peripapillary atrophy Pink and Sharp   C/D Ratio 0.5 0.5   Macula Flat, mac hole closing Flat, Good foveal reflex, Retinal pigment epithelial mottling, No heme or edema   Vessels Vascular attenuation, Copper wiring, AV crossing changes Vascular attenuation, Copper wiring, AV crossing changes   Periphery Attached, No heme  Attached, No heme          Refraction    Wearing Rx      Sphere Cylinder Axis Add   Right -0.25 +1.25 013 +2.25   Left -0.75 +1.00 170 +2.25   Type: Bifocal          IMAGING AND PROCEDURES  Imaging and Procedures for _0 @  OCT, Retina - OU - Both Eyes       Right Eye Quality was borderline. Central Foveal Thickness: 261. Progression has improved. Findings include normal foveal contour, no IRF, subretinal  fluid, outer retinal atrophy (Mac hole closed; normal foveal profile; trace pocket of central SRF and surrounding ORA).   Left Eye Quality was good. Central Foveal Thickness: 246. Progression has been stable. Findings include normal foveal contour, no IRF, no SRF, vitreomacular adhesion .   Notes *Images captured and stored on drive  Diagnosis / Impression:  OD: Mac hole closed; normal foveal profile; trace pocket of central SRF and surrounding ORA OS: NFP, no IRF/SRF, VMA  Clinical management:  See below  Abbreviations: NFP - Normal foveal profile. CME - cystoid macular edema. PED - pigment epithelial detachment. IRF - intraretinal fluid. SRF - subretinal fluid. EZ - ellipsoid zone. ERM - epiretinal membrane. ORA - outer retinal atrophy. ORT - outer retinal tubulation. SRHM - subretinal hyper-reflective material                 ASSESSMENT/PLAN:    ICD-10-CM   1. Macular hole of right eye  H35.341   2. Retinal edema  H35.81 OCT, Retina - OU - Both Eyes  3. Essential hypertension  I10   4. Hypertensive retinopathy of both eyes  H35.033   5. Combined forms of age-related cataract of both eyes  H25.813     1,2. Full thickness mac hole OD  - pt with mental retardation and lives in a group home of 6 residents and 3 care givers  - s/p PPV/ICG/MP/14% C3F8 OD, 10.15.20             - here for K check             - retina attached and good gas bubble in place w/ mac hole closing  - OCT shows mac hole closed with tr residual pocket of SRF centrally  - today, BCL in place but very dry with  +debris; epi defect persists -- now 3x5 mm defect  - new BCL placed 11.25.20 ([employer], [employer], N&D, BC: 8.6, DIA: 13.8)             - IOP 12  - gas bubble ~35-40%  - AC with improved cell/pigment  - cont gatifloxacin QID OD              - cont PF BID OD  - start valacyclovir 1034m TID  - PSO ung bedtime only OD -- d/c while BCL in place             - avoid laying flat on back              - eye shield at all times             - post op drop and positioning instructions reviewed              - tylenol/ibuprofen for pain  - f/u 6 days for repeat K check, DFE, OCT  3,4. Hypertensive retinopathy OU  - discussed importance of tight BP control  - monitor  5. Mixed form age related cataract OU  - The symptoms of cataract, surgical options, and treatments and risks were discussed with patient.  - discussed diagnosis and progression  - not yet visually significant  - monitor for now   Ophthalmic Meds Ordered this visit:  Meds ordered this encounter  Medications  . valACYclovir (VALTREX) 1000 MG tablet    Sig: Take 1 tablet (1,000 mg total) by mouth 3 (three) times daily.    Dispense:  90 tablet    Refill:  1  Return in about 6 days (around 02/14/2019) for f/u K check / POV, DFE, OCT.  There are no Patient Instructions on file for this visit.   Explained the diagnoses, plan, and follow up with the patient and they expressed understanding.  Patient expressed understanding of the importance of proper follow up care.   This document serves as a record of services personally performed by Gardiner Sleeper, MD, PhD. It was created on their behalf by Roselee Nova, COMT. The creation of this record is the provider's dictation and/or activities during the visit.  Electronically signed by: Roselee Nova, COMT 02/08/19 8:48 AM   This document serves as a record of services personally performed by Gardiner Sleeper, MD, PhD. It was created on their behalf by Ernest Mallick, OA, an  ophthalmic assistant. The creation of this record is the provider's dictation and/or activities during the visit.    Electronically signed by: Ernest Mallick, OA 11.25.2020 8:48 AM   Gardiner Sleeper, M.D., Ph.D. Diseases & Surgery of the Retina and [place] 02/08/2019   I have reviewed the above documentation for accuracy and completeness, and I agree with the above. Gardiner Sleeper, M.D., Ph.D. 02/08/19 8:48 AM    Abbreviations: M myopia (nearsighted); A astigmatism; H hyperopia (farsighted); P presbyopia; Mrx spectacle prescription;  CTL contact lenses; OD right eye; OS left eye; OU both eyes  XT exotropia; ET esotropia; PEK punctate epithelial keratitis; PEE punctate epithelial erosions; DES dry eye syndrome; MGD meibomian gland dysfunction; ATs artificial tears; PFAT's preservative free artificial tears; [place] nuclear sclerotic cataract; PSC posterior subcapsular cataract; ERM epi-retinal membrane; PVD posterior vitreous detachment; RD retinal detachment; DM diabetes mellitus; DR diabetic retinopathy; NPDR non-proliferative diabetic retinopathy; PDR proliferative diabetic retinopathy; CSME clinically significant macular edema; DME diabetic macular edema; dbh dot blot hemorrhages; CWS cotton wool spot; POAG primary open angle glaucoma; C/D cup-to-disc ratio; HVF humphrey visual field; GVF goldmann visual field; OCT optical coherence tomography; IOP intraocular pressure; BRVO Branch retinal vein occlusion; CRVO central retinal vein occlusion; CRAO central retinal artery occlusion; BRAO branch retinal artery occlusion; RT retinal tear; SB scleral buckle; PPV pars plana vitrectomy; VH Vitreous hemorrhage; PRP panretinal laser photocoagulation; IVK intravitreal kenalog; VMT vitreomacular traction; MH Macular hole;  NVD neovascularization of the disc; NVE neovascularization elsewhere; AREDS age related eye disease study; ARMD age related macular degeneration; POAG primary  open angle glaucoma; EBMD epithelial/anterior basement membrane dystrophy; ACIOL anterior chamber intraocular lens; IOL intraocular lens; PCIOL posterior chamber intraocular lens; Phaco/IOL phacoemulsification with intraocular lens placement; [place] photorefractive keratectomy; LASIK laser assisted in situ keratomileusis; HTN hypertension; DM diabetes mellitus; COPD chronic obstructive pulmonary disease

## 2019-02-08 ENCOUNTER — Ambulatory Visit (INDEPENDENT_AMBULATORY_CARE_PROVIDER_SITE_OTHER): Payer: Medicare HMO | Admitting: Ophthalmology

## 2019-02-08 ENCOUNTER — Encounter (INDEPENDENT_AMBULATORY_CARE_PROVIDER_SITE_OTHER): Payer: Self-pay | Admitting: Ophthalmology

## 2019-02-08 DIAGNOSIS — H3581 Retinal edema: Secondary | ICD-10-CM

## 2019-02-08 DIAGNOSIS — H35341 Macular cyst, hole, or pseudohole, right eye: Secondary | ICD-10-CM

## 2019-02-08 DIAGNOSIS — I10 Essential (primary) hypertension: Secondary | ICD-10-CM

## 2019-02-08 DIAGNOSIS — H35033 Hypertensive retinopathy, bilateral: Secondary | ICD-10-CM

## 2019-02-08 DIAGNOSIS — H25813 Combined forms of age-related cataract, bilateral: Secondary | ICD-10-CM

## 2019-02-08 MED ORDER — VALACYCLOVIR HCL 1 G PO TABS: 1000.0000 mg | ORAL_TABLET | Freq: Three times a day (TID) | ORAL | 1 refills | Status: AC

## 2019-02-13 NOTE — Progress Notes (Signed)
[facility] Note  02/14/2019     CHIEF COMPLAINT Patient presents for Retina Follow Up   HISTORY OF PRESENT ILLNESS: Roy Young is a 63 y.o. male who presents to the clinic today for:   HPI    Retina Follow Up    Patient presents with  Other.  In right eye.  This started 6 days ago.  Severity is moderate.  I, the attending physician,  performed the HPI with the patient and updated documentation appropriately.          Comments    Patient here for 6 days retina follow up for POV/ K check. Patient states vision seems to be ok. No eye pain.        Last edited by [name], MD on 02/14/2019  3:38 PM. (History)    pts caregiver states he has had a good week, he lost part of his hearing aid, so he is not wearing it today  Referring physician: [name], [place],  [place] [address]  HISTORICAL INFORMATION:   Selected notes from the [place] Referred by Dr. [name] for concern of ERM with mac hole OD   CURRENT MEDICATIONS: Current Outpatient Medications (Ophthalmic Drugs)  Medication Sig  . atropine 1 % ophthalmic solution Place 1 drop into the right eye daily.   . brimonidine (ALPHAGAN) 0.2 % ophthalmic solution Place 1 drop into the right eye daily.  Marland Kitchen gatifloxacin (ZYMAXID) 0.5 % SOLN Place 1 drop into the right eye 4 (four) times daily.  Marland Kitchen neomycin-bacitracin-polymyxin (NEOSPORIN) ophthalmic ointment Place 1 application into the right eye every 2 (two) hours. If contact lens come out, use every 2 hours  . prednisoLONE acetate (PRED FORTE) 1 % ophthalmic suspension Place 1 drop into the right eye every 2 (two) hours while awake.   No current facility-administered medications for this visit.  (Ophthalmic Drugs)   Current Outpatient Medications (Other)  Medication Sig  . carbamazepine (CARBATROL) 300 MG 12 hr capsule Take 300-600 mg by mouth See admin instructions. Take 300 mg in the morning and  600 mg in the evening  . divalproex (DEPAKOTE ER) 250 MG 24 hr tablet Take 250-750 mg by mouth See admin instructions. Take 500 mg in the morning, 750 mg Mid-day and in the evening  . ergocalciferol (VITAMIN D2) 50000 units capsule Take 50,000 Units by mouth once a week.  . Lacosamide (VIMPAT) 100 MG TABS Take 100 mg by mouth 2 (two) times daily.  Marland Kitchen levETIRAcetam (KEPPRA) 500 MG tablet Take 500 mg by mouth 2 (two) times daily.  Marland Kitchen loratadine (CLARITIN) 10 MG tablet Take 10 mg by mouth daily.  Marland Kitchen lovastatin (MEVACOR) 40 MG tablet Take 80 mg by mouth every evening.   . Multiple Vitamin (MULTI-VITAMIN) tablet Take 1 tablet by mouth daily. Chewable  . Multiple Vitamins-Minerals (MULTIVITAMIN ADULT PO) Take by mouth.  . propranolol (INDERAL) 10 MG tablet Take 10 mg by mouth 2 (two) times daily.   . sertraline (ZOLOFT) 100 MG tablet Take 100 mg by mouth at bedtime.   . valACYclovir (VALTREX) 1000 MG tablet Take 1 tablet (1,000 mg total) by mouth 3 (three) times daily.   No current facility-administered medications for this visit.  (Other)      REVIEW OF SYSTEMS: ROS    Positive for: Neurological, Eyes, Respiratory   Negative for: Constitutional, Gastrointestinal, Skin, Genitourinary, Musculoskeletal, HENT, Endocrine, Cardiovascular, Psychiatric, Allergic/Imm, Heme/Lymph   Last edited by  Theodore Demark, COA on 02/14/2019  2:54 PM. (History)       ALLERGIES No Known Allergies  PAST MEDICAL HISTORY Past Medical History:  Diagnosis Date  . Developmental delay   . Diabetes mellitus without complication ([place])    not on medication; diet-controlled  . Seizures ([place])   . Sleep apnea    Past Surgical History:  Procedure Laterality Date  . [place] VITRECTOMY WITH 20 GAUGE MVR PORT FOR MACULAR HOLE Right 12/29/2018   Procedure: 25 GAUGE PARS PLANA VITRECTOMY;  Surgeon: [name], MD;  Location: [facility];  Service: Ophthalmology;  Laterality: Right;    FAMILY HISTORY History  reviewed. No pertinent family history.  SOCIAL HISTORY Social History   Tobacco Use  . Smoking status: Never Smoker  . Smokeless tobacco: Never Used  Substance Use Topics  . Alcohol use: No    Alcohol/week: 0.0 standard drinks  . Drug use: Never         OPHTHALMIC EXAM:  Base Eye Exam    Visual Acuity (Snellen - Linear)      Right Left   Dist [place] 20/800 20/40 -2   Dist ph [place] 20/400 -1 20/30 -2       Tonometry (Tonopen, 2:51 PM)      Right Left   Pressure 17 21       Pupils      Dark Light Shape React APD   Right hazy       Left 3 2 Round Brisk None       Visual Fields (Counting fingers)      Left Right    Full Full       Extraocular Movement      Right Left    Full, Ortho Full, Ortho       Neuro/Psych    Oriented x3: Yes   Mood/Affect: Normal       Dilation    Both eyes: 1.0% Mydriacyl, 2.5% Phenylephrine @ 2:51 PM        Slit Lamp and Fundus Exam    Slit Lamp Exam      Right Left   Lids/Lashes Dermatochalasis - upper lid, mild Meibomian gland dysfunction Dermatochalasis - upper lid, mild Meibomian gland dysfunction   Conjunctiva/Sclera sutures intact - disolving, trace Injection White and quiet, mild melanosis   Cornea BCL in place with a lot of debris; 3.0Vx5.0H healing epi defect with 1+ Descemet's folds within, mild corneal haze, 1+ Punctate epithelial erosions; rolled edges improved trace Punctate epithelial erosions   Anterior Chamber Deep, 2+cell Deep and quiet   Iris Round and dilated Round and dilated   Lens 3+ Nuclear sclerosis with early brunescence, 2+ Cortical cataract 2-3+ Nuclear sclerosis with early brunescence, 2+ Cortical cataract   Vitreous post vitrectomy, gas bubble ~35-40% Mild Vitreous syneresis       Fundus Exam      Right Left   Disc Pink and Sharp, Peripapillary atrophy Pink and Sharp   C/D Ratio 0.5 0.5   Macula Flat, mac hole closing Flat, Good foveal reflex, Retinal pigment epithelial mottling, No heme or edema    Vessels Vascular attenuation, Copper wiring, AV crossing changes Vascular attenuation, Copper wiring, AV crossing changes   Periphery Attached, No heme  Attached, No heme           IMAGING AND PROCEDURES  Imaging and Procedures for _0 @  OCT, Retina - OU - Both Eyes       Right Eye Quality was  borderline. Central Foveal Thickness: 251. Progression has improved. Findings include normal foveal contour, no IRF, outer retinal atrophy, no SRF (Mac hole closed; normal foveal profile; interval improvement in focal pocket of SRF; persistent ORA).   Left Eye Quality was good. Central Foveal Thickness: 254. Progression has been stable. Findings include normal foveal contour, no IRF, no SRF, vitreomacular adhesion .   Notes *Images captured and stored on drive  Diagnosis / Impression:  OD: Mac hole closed; normal foveal profile; nterval improvement in focal pocket of SRF; persistent ORA OS: NFP, no IRF/SRF, VMA  Clinical management:  See below  Abbreviations: NFP - Normal foveal profile. CME - cystoid macular edema. PED - pigment epithelial detachment. IRF - intraretinal fluid. SRF - subretinal fluid. EZ - ellipsoid zone. ERM - epiretinal membrane. ORA - outer retinal atrophy. ORT - outer retinal tubulation. SRHM - subretinal hyper-reflective material                 ASSESSMENT/PLAN:    ICD-10-CM   1. Macular hole of right eye  H35.341   2. Retinal edema  H35.81 OCT, Retina - OU - Both Eyes  3. Essential hypertension  I10   4. Hypertensive retinopathy of both eyes  H35.033   5. Combined forms of age-related cataract of both eyes  H25.813     1,2. Full thickness mac hole OD  - pt with mental retardation and lives in a group home of 6 residents and 3 care givers  - s/p PPV/ICG/MP/14% C3F8 OD, 10.15.20             - here for K check             - retina attached and good gas bubble in place w/ mac hole closing  - OCT shows mac hole closed with tr residual pocket of SRF  centrally improved  - today, BCL in place but epi defect persists -- still 3x5 mm defect but rolled edges improved  - BCL originally placed 11.25.20 [employer], [employer], BC: 8.6, DIA: 13.8)             - IOP 17  - gas bubble ~25-30%             - will stop PF to try to promote corneal healing  - cont gatifloxacin QID OD   - cont valacyclovir 1042m TID  - PSO ung bedtime only OD -- d/c while BCL in place             - avoid laying flat on back              - eye shield at all times             - post op drop and positioning instructions reviewed              - tylenol/ibuprofen for pain  - f/u 1 week for repeat K check, DFE, OCT  - discussed possible referral to cornea specialist if epi defect fails to heal  3,4. Hypertensive retinopathy OU  - discussed importance of tight BP control  - monitor  5. Mixed form age related cataract OU  - The symptoms of cataract, surgical options, and treatments and risks were discussed with patient.  - discussed diagnosis and progression  - not yet visually significant  - monitor for now   Ophthalmic Meds Ordered this visit:  No orders of the defined types were placed in this encounter.  Return in about 1 week (around 02/21/2019) for f/u K check / POV OD.  There are no Patient Instructions on file for this visit.   Explained the diagnoses, plan, and follow up with the patient and they expressed understanding.  Patient expressed understanding of the importance of proper follow up care.   This document serves as a record of services personally performed by Gardiner Sleeper, MD, PhD. It was created on their behalf by Estill Bakes, COT an ophthalmic technician. The creation of this record is the provider's dictation and/or activities during the visit.    Electronically signed by: Estill Bakes, COT 02/13/19 @ 10:28 PM  Gardiner Sleeper, M.D., Ph.D. Diseases & Surgery of the Retina and [place] 02/14/2019   I have reviewed the above documentation for accuracy and completeness, and I agree with the above. Gardiner Sleeper, M.D., Ph.D. 02/14/19 10:28 PM    Abbreviations: M myopia (nearsighted); A astigmatism; H hyperopia (farsighted); P presbyopia; Mrx spectacle prescription;  CTL contact lenses; OD right eye; OS left eye; OU both eyes  XT exotropia; ET esotropia; PEK punctate epithelial keratitis; PEE punctate epithelial erosions; DES dry eye syndrome; MGD meibomian gland dysfunction; ATs artificial tears; PFAT's preservative free artificial tears; [place] nuclear sclerotic cataract; PSC posterior subcapsular cataract; ERM epi-retinal membrane; PVD posterior vitreous detachment; RD retinal detachment; DM diabetes mellitus; DR diabetic retinopathy; NPDR non-proliferative diabetic retinopathy; PDR proliferative diabetic retinopathy; CSME clinically significant macular edema; DME diabetic macular edema; dbh dot blot hemorrhages; CWS cotton wool spot; POAG primary open angle glaucoma; C/D cup-to-disc ratio; HVF humphrey visual field; GVF goldmann visual field; OCT optical coherence tomography; IOP intraocular pressure; BRVO Branch retinal vein occlusion; CRVO central retinal vein occlusion; CRAO central retinal artery occlusion; BRAO branch retinal artery occlusion; RT retinal tear; SB scleral buckle; PPV pars plana vitrectomy; VH Vitreous hemorrhage; PRP panretinal laser photocoagulation; IVK intravitreal kenalog; VMT vitreomacular traction; MH Macular hole;  NVD neovascularization of the disc; NVE neovascularization elsewhere; AREDS age related eye disease study; ARMD age related macular degeneration; POAG primary open angle glaucoma; EBMD epithelial/anterior basement membrane dystrophy; ACIOL anterior chamber intraocular lens; IOL intraocular lens; PCIOL posterior chamber intraocular lens; Phaco/IOL phacoemulsification with intraocular lens placement; [place] photorefractive keratectomy; LASIK laser  assisted in situ keratomileusis; HTN hypertension; DM diabetes mellitus; COPD chronic obstructive pulmonary disease

## 2019-02-14 ENCOUNTER — Encounter (INDEPENDENT_AMBULATORY_CARE_PROVIDER_SITE_OTHER): Payer: Self-pay | Admitting: Ophthalmology

## 2019-02-14 ENCOUNTER — Other Ambulatory Visit: Payer: Self-pay

## 2019-02-14 ENCOUNTER — Ambulatory Visit (INDEPENDENT_AMBULATORY_CARE_PROVIDER_SITE_OTHER): Payer: Medicare HMO | Admitting: Ophthalmology

## 2019-02-14 DIAGNOSIS — H3581 Retinal edema: Secondary | ICD-10-CM | POA: Diagnosis not present

## 2019-02-14 DIAGNOSIS — H35033 Hypertensive retinopathy, bilateral: Secondary | ICD-10-CM

## 2019-02-14 DIAGNOSIS — H25813 Combined forms of age-related cataract, bilateral: Secondary | ICD-10-CM

## 2019-02-14 DIAGNOSIS — H35341 Macular cyst, hole, or pseudohole, right eye: Secondary | ICD-10-CM

## 2019-02-14 DIAGNOSIS — I10 Essential (primary) hypertension: Secondary | ICD-10-CM

## 2019-02-20 NOTE — Progress Notes (Signed)
[facility] Note  02/22/2019     CHIEF COMPLAINT Patient presents for Retina Follow Up   HISTORY OF PRESENT ILLNESS: Roy Young is a 63 y.o. male who presents to the clinic today for:   HPI    Retina Follow Up    Patient presents with  Other.  In right eye.  This started months ago.  Severity is severe.  Duration of 1 week.  Since onset it is stable.  I, the attending physician,  performed the HPI with the patient and updated documentation appropriately.          Comments    63 y/o male pt here for 1 wk f/u and k ck OD.  S/p PPV/MP OD 10.15.20.  No change in [place] OU.  Denies pain, flashes, floaters.  Gatifloxacin QID OD.  Valcyclovir 1048m TID PO.       Last edited by ZBernarda Caffey MD on 02/22/2019 11:33 AM. (History)    pts caregiver states he has had a good week, caregiver states she was unable to get the antibiotic ointment until Monday bc his ins would not pay for it until then, she states she used the ointment instead, she has kept the patch on him at all times, pt denies eye pain   Referring physician: ANolene Ebbs MD [address]  [place] [id number] HISTORICAL INFORMATION:   Selected notes from the MBevierReferred by Dr. JMartiniqueDeMarco for concern of ERM with mac hole OD   CURRENT MEDICATIONS: Current Outpatient Medications (Ophthalmic Drugs)  Medication Sig  . gatifloxacin (ZYMAXID) 0.5 % SOLN Place 1 drop into the right eye 4 (four) times daily.  .Marland Kitchenatropine 1 % ophthalmic solution Place 1 drop into the right eye daily.   . brimonidine (ALPHAGAN) 0.2 % ophthalmic solution Place 1 drop into the right eye daily.  .Marland Kitchenneomycin-bacitracin-polymyxin (NEOSPORIN) ophthalmic ointment Place 1 application into the right eye every 2 (two) hours. If contact lens come out, use every 2 hours (Patient not taking: Reported on 02/22/2019)  . prednisoLONE acetate (PRED FORTE) 1 % ophthalmic suspension Place 1 drop into the  right eye every 2 (two) hours while awake. (Patient not taking: Reported on 02/22/2019)   No current facility-administered medications for this visit.  (Ophthalmic Drugs)   Current Outpatient Medications (Other)  Medication Sig  . carbamazepine (CARBATROL) 300 MG 12 hr capsule Take 300-600 mg by mouth See admin instructions. Take 300 mg in the morning and 600 mg in the evening  . DEPAKOTE ER 500 MG 24 hr tablet   . divalproex (DEPAKOTE ER) 250 MG 24 hr tablet Take 250-750 mg by mouth See admin instructions. Take 500 mg in the morning, 750 mg Mid-day and in the evening  . ergocalciferol (VITAMIN D2) 50000 units capsule Take 50,000 Units by mouth once a week.  . Lacosamide (VIMPAT) 100 MG TABS Take 100 mg by mouth 2 (two) times daily.  .Marland KitchenlevETIRAcetam (KEPPRA) 500 MG tablet Take 500 mg by mouth 2 (two) times daily.  .Marland Kitchenloratadine (CLARITIN) 10 MG tablet Take 10 mg by mouth daily.  .Marland Kitchenlovastatin (MEVACOR) 40 MG tablet Take 80 mg by mouth every evening.   . Multiple Vitamin (MULTI-VITAMIN) tablet Take 1 tablet by mouth daily. Chewable  . Multiple Vitamins-Minerals (MULTIVITAMIN ADULT PO) Take by mouth.  . propranolol (INDERAL) 10 MG tablet Take 10 mg by mouth 2 (two) times daily.   . sertraline (ZOLOFT) 100 MG  tablet Take 100 mg by mouth at bedtime.   . valACYclovir (VALTREX) 1000 MG tablet Take 1 tablet (1,000 mg total) by mouth 3 (three) times daily.  . sertraline (ZOLOFT) 50 MG tablet    No current facility-administered medications for this visit.  (Other)      REVIEW OF SYSTEMS: ROS    Positive for: Neurological, Eyes, Respiratory   Negative for: Constitutional, Gastrointestinal, Skin, Genitourinary, Musculoskeletal, HENT, Endocrine, Cardiovascular, Psychiatric, Allergic/Imm, Heme/Lymph   Last edited by Matthew Folks, COA on 02/22/2019 10:02 AM. (History)       ALLERGIES No Known Allergies  PAST MEDICAL HISTORY Past Medical History:  Diagnosis Date  . Cataract    OU  .  Developmental delay   . Diabetes mellitus without complication ([place])    not on medication; diet-controlled  . Hypertensive retinopathy    OU  . Seizures ([place])   . Sleep apnea    Past Surgical History:  Procedure Laterality Date  . [place] VITRECTOMY WITH 20 GAUGE MVR PORT FOR MACULAR HOLE Right 12/29/2018   Procedure: 25 GAUGE PARS PLANA VITRECTOMY;  Surgeon: [name], MD;  Location: [facility];  Service: Ophthalmology;  Laterality: Right;    FAMILY HISTORY History reviewed. No pertinent family history.  SOCIAL HISTORY Social History   Tobacco Use  . Smoking status: Never Smoker  . Smokeless tobacco: Never Used  Substance Use Topics  . Alcohol use: No    Alcohol/week: 0.0 standard drinks  . Drug use: Never         OPHTHALMIC EXAM:  Base Eye Exam    Visual Acuity (Snellen - Linear)      Right Left   Dist [place] CF @ face 20/40 -2   Dist ph [place] NI NI       Tonometry (Tonopen, 10:06 AM)      Right Left   Pressure 15 16       Pupils      Dark Light Shape React APD   Right 4 3 Round Minimal None   Left 3 2 Round Brisk None  View OD hazy       Visual Fields (Counting fingers)      Left Right    Full Full       Extraocular Movement      Right Left    Full, Ortho Full, Ortho       Neuro/Psych    Oriented x3: Yes   Mood/Affect: Normal       Dilation    Both eyes: 1.0% Mydriacyl, 2.5% Phenylephrine @ 10:06 AM        Slit Lamp and Fundus Exam    Slit Lamp Exam      Right Left   Lids/Lashes Dermatochalasis - upper lid, mild Meibomian gland dysfunction Dermatochalasis - upper lid, mild Meibomian gland dysfunction   Conjunctiva/Sclera sutures intact - disolving, trace Injection White and quiet, mild melanosis   Cornea BCL in place with a lot of debris; persistent 3.25Vx5.0H central epi defect with +Descemet's folds within, mild corneal haze, 1+ Punctate epithelial erosions; rolled edges improved trace Punctate epithelial erosions   Anterior  Chamber Deep, clear Deep and quiet   Iris Round and dilated Round and dilated   Lens 3+ Nuclear sclerosis with early brunescence, 2+ Cortical cataract 2-3+ Nuclear sclerosis with early brunescence, 2+ Cortical cataract   Vitreous post vitrectomy, gas bubble ~10-15% Mild Vitreous syneresis       Fundus Exam  Right Left   Disc Pink and Sharp, Peripapillary atrophy Pink and Sharp   C/D Ratio 0.5 0.5   Macula Flat, mac holed Flat, Good foveal reflex, Retinal pigment epithelial mottling, No heme or edema   Vessels Vascular attenuation, Copper wiring, AV crossing changes Vascular attenuation, Copper wiring, AV crossing changes   Periphery Attached, No heme  Attached, No heme           IMAGING AND PROCEDURES  Imaging and Procedures for _0 @  OCT, Retina - OU - Both Eyes       Right Eye Quality was borderline. Central Foveal Thickness: 258. Progression has been stable. Findings include normal foveal contour, no IRF, outer retinal atrophy, no SRF (Mac hole closed; normal foveal profile; persistent ORA).   Left Eye Quality was good. Central Foveal Thickness: 253. Progression has been stable. Findings include normal foveal contour, no IRF, no SRF, vitreomacular adhesion .   Notes *Images captured and stored on drive  Diagnosis / Impression:  OD: Mac hole closed; normal foveal profile; persistent ORA OS: NFP, no IRF/SRF, VMA  Clinical management:  See below  Abbreviations: NFP - Normal foveal profile. CME - cystoid macular edema. PED - pigment epithelial detachment. IRF - intraretinal fluid. SRF - subretinal fluid. EZ - ellipsoid zone. ERM - epiretinal membrane. ORA - outer retinal atrophy. ORT - outer retinal tubulation. SRHM - subretinal hyper-reflective material                 ASSESSMENT/PLAN:    ICD-10-CM   1. Macular hole of right eye  H35.341   2. Retinal edema  H35.81 OCT, Retina - OU - Both Eyes  3. Essential hypertension  I10   4. Hypertensive  retinopathy of both eyes  H35.033   5. Combined forms of age-related cataract of both eyes  H25.813     1,2. Full thickness mac hole OD  - pt with mental retardation and lives in a group home of 6 residents and 3 care givers  - s/p PPV/ICG/MP/14% C3F8 OD, 10.15.20             - here for K check -- persistent/recurrent post op epi defect since 10.29.20             - retina attached and good gas bubble in place w/ mac hole closing  - OCT shows mac hole closed  - today, BCL in place but epi defect persists -- larger today at 3.25Vx5H mm defect but rolled edges improved  - BCL originally placed 11.25.20 [employer], [employer], BC: 8.6, DIA: 13.8)             - IOP 15  - gas bubble ~10-15%             - stopped PF to try to promote corneal healing  - cont gatifloxacin QID OD -- ran out over the weekend, restarted on Monday  - cont valacyclovir 1041m TID  - PSO ung bedtime only OD -- d/c while BCL in place             - avoid laying flat on back              - eye shield at all times             - post op drop and positioning instructions reviewed              - tylenol/ibuprofen for pain  - f/u 2 weeks for repeat K check,  DFE, OCT  - will refer to [name] for second opinion and eval/management of central corneal epi defect  3,4. Hypertensive retinopathy OU  - discussed importance of tight BP control  - monitor  5. Mixed form age related cataract OU  - The symptoms of cataract, surgical options, and treatments and risks were discussed with patient.  - discussed diagnosis and progression  - not yet visually significant  - monitor for now   Ophthalmic Meds Ordered this visit:  No orders of the defined types were placed in this encounter.      Return in about 2 weeks (around 03/08/2019) for f/u [place] OD, DFE, OCT.  There are no Patient Instructions on file for this visit.   Explained the diagnoses, plan, and follow up with the patient and they expressed understanding.   Patient expressed understanding of the importance of proper follow up care.   This document serves as a record of services personally performed by Gardiner Sleeper, MD, PhD. It was created on their behalf by Roselee Nova, COMT. The creation of this record is the provider's dictation and/or activities during the visit.  Electronically signed by: Roselee Nova, COMT 02/22/19 11:34 AM   This document serves as a record of services personally performed by Gardiner Sleeper, MD, PhD. It was created on their behalf by Ernest Mallick, OA, an ophthalmic assistant. The creation of this record is the provider's dictation and/or activities during the visit.    Electronically signed by: Ernest Mallick, OA 12.09.2020 11:34 AM   Gardiner Sleeper, M.D., Ph.D. Diseases & Surgery of the Retina and [place] 02/22/2019   I have reviewed the above documentation for accuracy and completeness, and I agree with the above. Gardiner Sleeper, M.D., Ph.D. 02/22/19 11:37 AM    Abbreviations: M myopia (nearsighted); A astigmatism; H hyperopia (farsighted); P presbyopia; Mrx spectacle prescription;  CTL contact lenses; OD right eye; OS left eye; OU both eyes  XT exotropia; ET esotropia; PEK punctate epithelial keratitis; PEE punctate epithelial erosions; DES dry eye syndrome; MGD meibomian gland dysfunction; ATs artificial tears; PFAT's preservative free artificial tears; [place] nuclear sclerotic cataract; PSC posterior subcapsular cataract; ERM epi-retinal membrane; PVD posterior vitreous detachment; RD retinal detachment; DM diabetes mellitus; DR diabetic retinopathy; NPDR non-proliferative diabetic retinopathy; PDR proliferative diabetic retinopathy; CSME clinically significant macular edema; DME diabetic macular edema; dbh dot blot hemorrhages; CWS cotton wool spot; POAG primary open angle glaucoma; C/D cup-to-disc ratio; HVF humphrey visual field; GVF goldmann visual field; OCT optical coherence  tomography; IOP intraocular pressure; BRVO Branch retinal vein occlusion; CRVO central retinal vein occlusion; CRAO central retinal artery occlusion; BRAO branch retinal artery occlusion; RT retinal tear; SB scleral buckle; PPV pars plana vitrectomy; VH Vitreous hemorrhage; PRP panretinal laser photocoagulation; IVK intravitreal kenalog; VMT vitreomacular traction; MH Macular hole;  NVD neovascularization of the disc; NVE neovascularization elsewhere; AREDS age related eye disease study; ARMD age related macular degeneration; POAG primary open angle glaucoma; EBMD epithelial/anterior basement membrane dystrophy; ACIOL anterior chamber intraocular lens; IOL intraocular lens; PCIOL posterior chamber intraocular lens; Phaco/IOL phacoemulsification with intraocular lens placement; [place] photorefractive keratectomy; LASIK laser assisted in situ keratomileusis; HTN hypertension; DM diabetes mellitus; COPD chronic obstructive pulmonary disease

## 2019-02-22 ENCOUNTER — Ambulatory Visit (INDEPENDENT_AMBULATORY_CARE_PROVIDER_SITE_OTHER): Payer: Medicare HMO | Admitting: Ophthalmology

## 2019-02-22 ENCOUNTER — Encounter (INDEPENDENT_AMBULATORY_CARE_PROVIDER_SITE_OTHER): Payer: Self-pay | Admitting: Ophthalmology

## 2019-02-22 ENCOUNTER — Other Ambulatory Visit: Payer: Self-pay

## 2019-02-22 DIAGNOSIS — H35341 Macular cyst, hole, or pseudohole, right eye: Secondary | ICD-10-CM

## 2019-02-22 DIAGNOSIS — H35033 Hypertensive retinopathy, bilateral: Secondary | ICD-10-CM

## 2019-02-22 DIAGNOSIS — H3581 Retinal edema: Secondary | ICD-10-CM

## 2019-02-22 DIAGNOSIS — I10 Essential (primary) hypertension: Secondary | ICD-10-CM

## 2019-02-22 DIAGNOSIS — H25813 Combined forms of age-related cataract, bilateral: Secondary | ICD-10-CM

## 2019-03-06 ENCOUNTER — Encounter (INDEPENDENT_AMBULATORY_CARE_PROVIDER_SITE_OTHER): Payer: Medicare HMO | Admitting: Ophthalmology

## 2019-03-06 NOTE — Progress Notes (Signed)
[facility] Note  03/08/2019     CHIEF COMPLAINT Patient presents for Retina Follow Up   HISTORY OF PRESENT ILLNESS: Roy Young is a 63 y.o. male who presents to the clinic today for:   HPI    Retina Follow Up    In right eye.  This started 1 month ago.  Since onset it is stable.  I, the attending physician,  performed the HPI with the patient and updated documentation appropriately.          Comments    F/U Patient accompanied by caretaker, Patient vision is "good"per patient, denies flashes and ocular pain. BS 106 this am, Bs WINL per caretaker.        Last edited by [name], MD on 03/09/2019 11:52 AM. (History)    pts caregiver states they are anxious to get the new eye drop from [name], pt had an amneotic membrane placed by [name], but had it removed this past Monday, caregiver states [name] gave him a new drop (?besivance) that she is alternating every 2 hours with zymaxid  Referring physician: [name], MD [place],  [place] [address]  HISTORICAL INFORMATION:   Selected notes from the MEDICAL RECORD NUMBER Referred by Dr. [name] for concern of ERM with mac hole OD   CURRENT MEDICATIONS: Current Outpatient Medications (Ophthalmic Drugs)  Medication Sig  . atropine 1 % ophthalmic solution Place 1 drop into the right eye daily.   . brimonidine (ALPHAGAN) 0.2 % ophthalmic solution Place 1 drop into the right eye daily.  Marland Kitchen gatifloxacin (ZYMAXID) 0.5 % SOLN Place 1 drop into the right eye 4 (four) times daily.  Marland Kitchen neomycin-bacitracin-polymyxin (NEOSPORIN) ophthalmic ointment Place 1 application into the right eye every 2 (two) hours. If contact lens come out, use every 2 hours  . prednisoLONE acetate (PRED FORTE) 1 % ophthalmic suspension Place 1 drop into the right eye every 2 (two) hours while awake.   No current facility-administered medications for this visit. (Ophthalmic  Drugs)   Current Outpatient Medications (Other)  Medication Sig  . carbamazepine (CARBATROL) 300 MG 12 hr capsule Take 300-600 mg by mouth See admin instructions. Take 300 mg in the morning and 600 mg in the evening  . DEPAKOTE ER 500 MG 24 hr tablet   . divalproex (DEPAKOTE ER) 250 MG 24 hr tablet Take 250-750 mg by mouth See admin instructions. Take 500 mg in the morning, 750 mg Mid-day and in the evening  . ergocalciferol (VITAMIN D2) 50000 units capsule Take 50,000 Units by mouth once a week.  . Lacosamide (VIMPAT) 100 MG TABS Take 100 mg by mouth 2 (two) times daily.  Marland Kitchen levETIRAcetam (KEPPRA) 500 MG tablet Take 500 mg by mouth 2 (two) times daily.  Marland Kitchen loratadine (CLARITIN) 10 MG tablet Take 10 mg by mouth daily.  Marland Kitchen lovastatin (MEVACOR) 40 MG tablet Take 80 mg by mouth every evening.   . Multiple Vitamin (MULTI-VITAMIN) tablet Take 1 tablet by mouth daily. Chewable  . Multiple Vitamins-Minerals (MULTIVITAMIN ADULT PO) Take by mouth.  . propranolol (INDERAL) 10 MG tablet Take 10 mg by mouth 2 (two) times daily.   . sertraline (ZOLOFT) 100 MG tablet Take 100 mg by mouth at bedtime.   . sertraline (ZOLOFT) 50 MG tablet   . valACYclovir (VALTREX) 1000 MG tablet Take 1 tablet (1,000 mg total) by mouth 3 (three) times daily.   No current facility-administered medications for this  visit. (Other)      REVIEW OF SYSTEMS: ROS    Positive for: Endocrine, Eyes   Negative for: Constitutional, Gastrointestinal, Neurological, Skin, Genitourinary, Musculoskeletal, HENT, Cardiovascular, Respiratory, Psychiatric, Allergic/Imm, Heme/Lymph   Last edited by Zenovia Jordan, LPN on 75/12/2583  2:77 AM. (History)       ALLERGIES No Known Allergies  PAST MEDICAL HISTORY Past Medical History:  Diagnosis Date  . Cataract    OU  . Developmental delay   . Diabetes mellitus without complication ([place])    not on medication; diet-controlled  . Hypertensive retinopathy    OU  . Seizures ([place])   .  Sleep apnea    Past Surgical History:  Procedure Laterality Date  . [place] VITRECTOMY WITH 20 GAUGE MVR PORT FOR MACULAR HOLE Right 12/29/2018   Procedure: 25 GAUGE PARS PLANA VITRECTOMY;  Surgeon: [name], MD;  Location: [facility];  Service: Ophthalmology;  Laterality: Right;    FAMILY HISTORY History reviewed. No pertinent family history.  SOCIAL HISTORY Social History   Tobacco Use  . Smoking status: Never Smoker  . Smokeless tobacco: Never Used  Substance Use Topics  . Alcohol use: No    Alcohol/week: 0.0 standard drinks  . Drug use: Never         OPHTHALMIC EXAM:  Base Eye Exam    Visual Acuity (Snellen - Linear)      Right Left   Dist [place] CF at 3' 20/40   Dist ph [place]  NI       Tonometry (Tonopen, 8:52 AM)      Right Left   Pressure 15 11       Pupils      Dark Light Shape React APD   Right 4 3 Round Slow None   Left 4 3 Round Brisk None       Visual Fields      Left Right    Full Full       Extraocular Movement      Right Left    Full, Ortho Full, Ortho       Neuro/Psych    Oriented x3: Yes   Mood/Affect: Normal       Dilation    Both eyes: 1.0% Mydriacyl, 2.5% Phenylephrine @ 8:52 AM        Slit Lamp and Fundus Exam    Slit Lamp Exam      Right Left   Lids/Lashes Dermatochalasis - upper lid, mild Meibomian gland dysfunction Dermatochalasis - upper lid, mild Meibomian gland dysfunction   Conjunctiva/Sclera sutures dissolving, trace Injection White and quiet, mild melanosis   Cornea No BCL in place; central corneal haze and edema with epi defect within (1.5Vx,3.0H) trace Punctate epithelial erosions   Anterior Chamber Deep, clear Deep and quiet   Iris Round and dilated Round and dilated   Lens 3+ Nuclear sclerosis with brunescence, 2+ Cortical cataract 2-3+ Nuclear sclerosis with early brunescence, 2+ Cortical cataract   Vitreous post vitrectomy, gas bubble ~5% Mild Vitreous syneresis       Fundus Exam      Right Left    Disc Pink and Sharp, Peripapillary atrophy Pink and Sharp   C/D Ratio 0.5 0.5   Macula Flat, mac hole closed Flat, Good foveal reflex, Retinal pigment epithelial mottling, No heme or edema   Vessels Vascular attenuation, Copper wiring, AV crossing changes Vascular attenuation, Copper wiring, AV crossing changes   Periphery Attached, No heme  Attached, No heme  IMAGING AND PROCEDURES  Imaging and Procedures for _0 @           ASSESSMENT/PLAN:    ICD-10-CM   1. Macular hole of right eye  H35.341   2. Retinal edema  H35.81 CANCELED: OCT, Retina - OU - Both Eyes  3. Essential hypertension  I10   4. Hypertensive retinopathy of both eyes  H35.033   5. Combined forms of age-related cataract of both eyes  H25.813     1,2. Full thickness mac hole OD  - pt with mental retardation and lives in a group home of 6 residents and 3 care givers  - s/p PPV/ICG/MP/14% C3F8 OD, 10.15.20             - here for K check -- persistent/recurrent post op epi defect since 10.29.20             - retina attached and good gas bubble in place w/ mac hole closing  - prior OCTs show mac hole closed  - now under the expert management of [name] for management of diabetic neurotrophic cornea  - today, no BCL or amniotic membrane in place -- using gatiflox and ?besivance q2h alternating  - epi defect persists but smaller today at 1.5Vx3H mm             - IOP 15  - gas bubble ~5%             - stopped PF to try to promote corneal healing  - cont gatifloxacin and ?besivance every 2 hours per [name]  - cont valacyclovir 109m TID             - avoid laying flat on back              - eye shield at all times             - post op drop and positioning instructions reviewed              - tylenol/ibuprofen for pain  - next appt with Dr. SLucita Ferrara12.28.20  - f/u here 3-4 weeks to check on gas bubble  3,4. Hypertensive retinopathy OU  - discussed importance of tight BP  control  - monitor  5. Mixed form age related cataract OU  - The symptoms of cataract, surgical options, and treatments and risks were discussed with patient.  - discussed diagnosis and progression  - not yet visually significant  - monitor for now   Ophthalmic Meds Ordered this visit:  No orders of the defined types were placed in this encounter.      Return for f/u 3-4 weeks, FTMH OD, DFE, OCT.  There are no Patient Instructions on file for this visit.   Explained the diagnoses, plan, and follow up with the patient and they expressed understanding.  Patient expressed understanding of the importance of proper follow up care.   This document serves as a record of services personally performed by BGardiner Sleeper MD, PhD. It was created on their behalf by DRoselee Nova COMT. The creation of this record is the provider's dictation and/or activities during the visit.  Electronically signed by: DRoselee Nova COMT 03/09/19 12:01 PM   This document serves as a record of services personally performed by BGardiner Sleeper MD, PhD. It was created on their behalf by AErnest Mallick OA, an ophthalmic assistant. The creation of this record is the provider's dictation and/or activities during the visit.    Electronically signed  by: Ernest Mallick, OA 12.23.2020 12:01 PM   Gardiner Sleeper, M.D., Ph.D. Diseases & Surgery of the Retina and [place] 03/08/2019   I have reviewed the above documentation for accuracy and completeness, and I agree with the above. Gardiner Sleeper, M.D., Ph.D. 03/09/19 12:01 PM    Abbreviations: M myopia (nearsighted); A astigmatism; H hyperopia (farsighted); P presbyopia; Mrx spectacle prescription;  CTL contact lenses; OD right eye; OS left eye; OU both eyes  XT exotropia; ET esotropia; PEK punctate epithelial keratitis; PEE punctate epithelial erosions; DES dry eye syndrome; MGD meibomian gland dysfunction; ATs artificial tears;  PFAT's preservative free artificial tears; [place] nuclear sclerotic cataract; PSC posterior subcapsular cataract; ERM epi-retinal membrane; PVD posterior vitreous detachment; RD retinal detachment; DM diabetes mellitus; DR diabetic retinopathy; NPDR non-proliferative diabetic retinopathy; PDR proliferative diabetic retinopathy; CSME clinically significant macular edema; DME diabetic macular edema; dbh dot blot hemorrhages; CWS cotton wool spot; POAG primary open angle glaucoma; C/D cup-to-disc ratio; HVF humphrey visual field; GVF goldmann visual field; OCT optical coherence tomography; IOP intraocular pressure; BRVO Branch retinal vein occlusion; CRVO central retinal vein occlusion; CRAO central retinal artery occlusion; BRAO branch retinal artery occlusion; RT retinal tear; SB scleral buckle; PPV pars plana vitrectomy; VH Vitreous hemorrhage; PRP panretinal laser photocoagulation; IVK intravitreal kenalog; VMT vitreomacular traction; MH Macular hole;  NVD neovascularization of the disc; NVE neovascularization elsewhere; AREDS age related eye disease study; ARMD age related macular degeneration; POAG primary open angle glaucoma; EBMD epithelial/anterior basement membrane dystrophy; ACIOL anterior chamber intraocular lens; IOL intraocular lens; PCIOL posterior chamber intraocular lens; Phaco/IOL phacoemulsification with intraocular lens placement; [place] photorefractive keratectomy; LASIK laser assisted in situ keratomileusis; HTN hypertension; DM diabetes mellitus; COPD chronic obstructive pulmonary disease

## 2019-03-08 ENCOUNTER — Ambulatory Visit (INDEPENDENT_AMBULATORY_CARE_PROVIDER_SITE_OTHER): Payer: Medicare HMO | Admitting: Ophthalmology

## 2019-03-08 ENCOUNTER — Encounter (INDEPENDENT_AMBULATORY_CARE_PROVIDER_SITE_OTHER): Payer: Self-pay | Admitting: Ophthalmology

## 2019-03-08 DIAGNOSIS — H3581 Retinal edema: Secondary | ICD-10-CM

## 2019-03-08 DIAGNOSIS — H25813 Combined forms of age-related cataract, bilateral: Secondary | ICD-10-CM

## 2019-03-08 DIAGNOSIS — H35341 Macular cyst, hole, or pseudohole, right eye: Secondary | ICD-10-CM

## 2019-03-08 DIAGNOSIS — H35033 Hypertensive retinopathy, bilateral: Secondary | ICD-10-CM

## 2019-03-08 DIAGNOSIS — I10 Essential (primary) hypertension: Secondary | ICD-10-CM

## 2019-03-09 ENCOUNTER — Encounter (INDEPENDENT_AMBULATORY_CARE_PROVIDER_SITE_OTHER): Payer: Self-pay | Admitting: Ophthalmology

## 2019-03-22 NOTE — Progress Notes (Signed)
[facility] Note  04/03/2019     CHIEF COMPLAINT Patient presents for Retina Follow Up   HISTORY OF PRESENT ILLNESS: Roy Young is a 64 y.o. male who presents to the clinic today for:   HPI    Retina Follow Up    Patient presents with  Other.  In right eye.  This started weeks ago.  Severity is moderate.  Duration of weeks.  Since onset it is stable.  I, the attending physician,  performed the HPI with the patient and updated documentation appropriately.          Comments    Pt caregiver states there have not been any complaints of pain or discomfort and no change in vision that she is aware of or that patient has complained of.         Last edited by [name], MD on 04/09/2019  1:24 AM. (History)    pt has been seeing [name] for his cornea, pts caregiver states that he has started on Oxervate as of last Friday, caregiver states that per Oxervate she was to remove the BCL, caregiver states she has stopped all the other drops  Referring physician: [name], MD [place],  [place] [address]  HISTORICAL INFORMATION:   Selected notes from the [place] Referred by Dr. [name] for concern of ERM with mac hole OD   CURRENT MEDICATIONS: Current Outpatient Medications (Ophthalmic Drugs)  Medication Sig  . atropine 1 % ophthalmic solution Place 1 drop into the right eye daily.   . brimonidine (ALPHAGAN) 0.2 % ophthalmic solution Place 1 drop into the right eye daily.  Marland Kitchen gatifloxacin (ZYMAXID) 0.5 % SOLN Place 1 drop into the right eye 4 (four) times daily.  Marland Kitchen neomycin-bacitracin-polymyxin (NEOSPORIN) ophthalmic ointment Place 1 application into the right eye every 2 (two) hours. If contact lens come out, use every 2 hours  . prednisoLONE acetate (PRED FORTE) 1 % ophthalmic suspension Place 1 drop into the right eye every 2 (two) hours while awake.   No current facility-administered medications for  this visit. (Ophthalmic Drugs)   Current Outpatient Medications (Other)  Medication Sig  . carbamazepine (CARBATROL) 300 MG 12 hr capsule Take 300-600 mg by mouth See admin instructions. Take 300 mg in the morning and 600 mg in the evening  . DEPAKOTE ER 500 MG 24 hr tablet   . divalproex (DEPAKOTE ER) 250 MG 24 hr tablet Take 250-750 mg by mouth See admin instructions. Take 500 mg in the morning, 750 mg Mid-day and in the evening  . ergocalciferol (VITAMIN D2) 50000 units capsule Take 50,000 Units by mouth once a week.  . Lacosamide (VIMPAT) 100 MG TABS Take 100 mg by mouth 2 (two) times daily.  Marland Kitchen levETIRAcetam (KEPPRA) 500 MG tablet Take 500 mg by mouth 2 (two) times daily.  Marland Kitchen loratadine (CLARITIN) 10 MG tablet Take 10 mg by mouth daily.  Marland Kitchen lovastatin (MEVACOR) 40 MG tablet Take 80 mg by mouth every evening.   . Multiple Vitamin (MULTI-VITAMIN) tablet Take 1 tablet by mouth daily. Chewable  . Multiple Vitamins-Minerals (MULTIVITAMIN ADULT PO) Take by mouth.  . propranolol (INDERAL) 10 MG tablet Take 10 mg by mouth 2 (two) times daily.   . sertraline (ZOLOFT) 100 MG tablet Take 100 mg by mouth at bedtime.   . sertraline (ZOLOFT) 50 MG tablet    No current facility-administered medications for this visit. (Other)  REVIEW OF SYSTEMS: ROS    Positive for: Endocrine, Eyes   Negative for: Constitutional, Gastrointestinal, Neurological, Skin, Genitourinary, Musculoskeletal, HENT, Cardiovascular, Respiratory, Psychiatric, Allergic/Imm, Heme/Lymph   Last edited by Doneen Poisson on 04/03/2019  9:00 AM. (History)       ALLERGIES No Known Allergies  PAST MEDICAL HISTORY Past Medical History:  Diagnosis Date  . Cataract    OU  . Developmental delay   . Diabetes mellitus without complication ([place])    not on medication; diet-controlled  . Hypertensive retinopathy    OU  . Seizures ([place])   . Sleep apnea    Past Surgical History:  Procedure Laterality Date  . [place] VITRECTOMY WITH 20 GAUGE MVR PORT FOR MACULAR HOLE Right 12/29/2018   Procedure: 25 GAUGE PARS PLANA VITRECTOMY;  Surgeon: [name], MD;  Location: [facility];  Service: Ophthalmology;  Laterality: Right;    FAMILY HISTORY History reviewed. No pertinent family history.  SOCIAL HISTORY Social History   Tobacco Use  . Smoking status: Never Smoker  . Smokeless tobacco: Never Used  Substance Use Topics  . Alcohol use: No    Alcohol/week: 0.0 standard drinks  . Drug use: Never         OPHTHALMIC EXAM:  Base Eye Exam    Visual Acuity (Snellen - Linear)      Right Left   Dist Weed CF @ 3' 20/40 -2   Dist ph Icard NI 20/30 -2       Tonometry (Tonopen, 9:05 AM)      Right Left   Pressure 17 13  Squeezing       Pupils      Dark Light Shape React APD   Right 5 4 Round Minimal 0   Left 4 3 Round Minimal 0  Hazy view OD       Visual Fields      Left Right    Full Full       Extraocular Movement      Right Left    Full Full       Neuro/Psych    Oriented x3: Yes   Mood/Affect: Normal       Dilation    Both eyes: 1.0% Mydriacyl, 2.5% Phenylephrine @ 9:06 AM        Slit Lamp and Fundus Exam    Slit Lamp Exam      Right Left   Lids/Lashes Dermatochalasis - upper lid, mild Meibomian gland dysfunction Dermatochalasis - upper lid, mild Meibomian gland dysfunction   Conjunctiva/Sclera sutures dissolving, trace Injection White and quiet, mild melanosis   Cornea No BCL; central corneal haze, irreguler epi trace Punctate epithelial erosions   Anterior Chamber Deep, clear Deep and quiet   Iris Round and dilated Round and dilated   Lens 3-4+ Nuclear sclerosis with brunescence, 2-3+ Cortical cataract 2-3+ Nuclear sclerosis with early brunescence, 2+ Cortical cataract   Vitreous post vitrectomy, gas bubble now gone Mild Vitreous syneresis       Fundus Exam      Right Left   Disc Mild Pallor, Sharp rim Pink and Sharp   C/D Ratio 0.5 0.5   Macula Flat, mac hole  closed Flat, Good foveal reflex, Retinal pigment epithelial mottling, No heme or edema   Vessels Vascular attenuation, Copper wiring, AV crossing changes Vascular attenuation, Copper wiring, AV crossing changes   Periphery Attached, No heme  Attached, No heme           IMAGING  AND PROCEDURES  Imaging and Procedures for _0 @  OCT, Retina - OU - Both Eyes       Right Eye Quality was poor. Central Foveal Thickness: 249. Progression has been stable. Findings include normal foveal contour, no IRF, outer retinal atrophy, no SRF (Mac hole closed; normal foveal profile; persistent ORA).   Left Eye Quality was good. Central Foveal Thickness: 246. Progression has been stable. Findings include normal foveal contour, no IRF, no SRF, vitreomacular adhesion .   Notes *Images captured and stored on drive  Diagnosis / Impression:  OD: Mac hole closed; normal foveal profile; persistent ORA OS: NFP, no IRF/SRF, VMA  Clinical management:  See below  Abbreviations: NFP - Normal foveal profile. CME - cystoid macular edema. PED - pigment epithelial detachment. IRF - intraretinal fluid. SRF - subretinal fluid. EZ - ellipsoid zone. ERM - epiretinal membrane. ORA - outer retinal atrophy. ORT - outer retinal tubulation. SRHM - subretinal hyper-reflective material                 ASSESSMENT/PLAN:    ICD-10-CM   1. Macular hole of right eye  H35.341   2. Retinal edema  H35.81 OCT, Retina - OU - Both Eyes  3. Essential hypertension  I10   4. Hypertensive retinopathy of both eyes  H35.033   5. Combined forms of age-related cataract of both eyes  H25.813     1,2. Full thickness mac hole OD  - pt with mental retardation and lives in a group home of 6 residents and 3 care givers  - s/p PPV/ICG/MP/14% C3F8 OD, 10.15.20             - here for K check -- persistent/recurrent post op epi defect since 10.29.20             - retina attached and good gas bubble in place w/ mac hole closing  -  prior OCTs show mac hole closed  - now under the expert management of [name] for management of diabetic neurotrophic cornea  - today, no BCL or amniotic membrane in place -- using gatiflox and ?besivance q2h alternating  - no epi defect today             - IOP 17  - gas bubble now gone             - stopped PF to try to promote corneal healing  - cont gatifloxacin and ?besivance every 2 hours per [name] -- caregiver has stopped  - cont Oxervate 6x/day per [name]  - cont valacyclovir 1041m TID             - eye shield at all times             - post op drop and positioning instructions reviewed              - tylenol/ibuprofen for pain  - f/u 8-10 weeks  3,4. Hypertensive retinopathy OU  - discussed importance of tight BP control  - monitor  5. Mixed form age related cataract OU  - The symptoms of cataract, surgical options, and treatments and risks were discussed with patient.  - discussed diagnosis and progression  - not yet visually significant  - monitor for now   Ophthalmic Meds Ordered this visit:  No orders of the defined types were placed in this encounter.      Return for f/u 8-10 weeks FTMH DO, DFE, OCT.  There are no Patient Instructions  on file for this visit.   Explained the diagnoses, plan, and follow up with the patient and they expressed understanding.  Patient expressed understanding of the importance of proper follow up care.   This document serves as a record of services personally performed by Gardiner Sleeper, MD, PhD. It was created on their behalf by Leeann Must, [place], a certified ophthalmic assistant. The creation of this record is the provider's dictation and/or activities during the visit.    Electronically signed by: Leeann Must, COA _0 @ 1:25 AM   This document serves as a record of services personally performed by Gardiner Sleeper, MD, PhD. It was created on their behalf by Ernest Mallick, OA, an ophthalmic  assistant. The creation of this record is the provider's dictation and/or activities during the visit.    Electronically signed by: Ernest Mallick, OA 01.18.2021 1:25 AM   Gardiner Sleeper, M.D., Ph.D. Diseases & Surgery of the Retina and Vitreous [facility]  I have reviewed the above documentation for accuracy and completeness, and I agree with the above. Gardiner Sleeper, M.D., Ph.D. 04/09/19 1:25 AM   Abbreviations: M myopia (nearsighted); A astigmatism; H hyperopia (farsighted); P presbyopia; Mrx spectacle prescription;  CTL contact lenses; OD right eye; OS left eye; OU both eyes  XT exotropia; ET esotropia; PEK punctate epithelial keratitis; PEE punctate epithelial erosions; DES dry eye syndrome; MGD meibomian gland dysfunction; ATs artificial tears; PFAT's preservative free artificial tears; [place] nuclear sclerotic cataract; PSC posterior subcapsular cataract; ERM epi-retinal membrane; PVD posterior vitreous detachment; RD retinal detachment; DM diabetes mellitus; DR diabetic retinopathy; NPDR non-proliferative diabetic retinopathy; PDR proliferative diabetic retinopathy; CSME clinically significant macular edema; DME diabetic macular edema; dbh dot blot hemorrhages; CWS cotton wool spot; POAG primary open angle glaucoma; C/D cup-to-disc ratio; HVF humphrey visual field; GVF goldmann visual field; OCT optical coherence tomography; IOP intraocular pressure; BRVO Branch retinal vein occlusion; CRVO central retinal vein occlusion; CRAO central retinal artery occlusion; BRAO branch retinal artery occlusion; RT retinal tear; SB scleral buckle; PPV pars plana vitrectomy; VH Vitreous hemorrhage; PRP panretinal laser photocoagulation; IVK intravitreal kenalog; VMT vitreomacular traction; MH Macular hole;  NVD neovascularization of the disc; NVE neovascularization elsewhere; AREDS age related eye disease study; ARMD age related macular degeneration; POAG primary open angle glaucoma; EBMD  epithelial/anterior basement membrane dystrophy; ACIOL anterior chamber intraocular lens; IOL intraocular lens; PCIOL posterior chamber intraocular lens; Phaco/IOL phacoemulsification with intraocular lens placement; [place] photorefractive keratectomy; LASIK laser assisted in situ keratomileusis; HTN hypertension; DM diabetes mellitus; COPD chronic obstructive pulmonary disease

## 2019-04-03 ENCOUNTER — Ambulatory Visit (INDEPENDENT_AMBULATORY_CARE_PROVIDER_SITE_OTHER): Payer: Medicare HMO | Admitting: Ophthalmology

## 2019-04-03 ENCOUNTER — Encounter (INDEPENDENT_AMBULATORY_CARE_PROVIDER_SITE_OTHER): Payer: Self-pay | Admitting: Ophthalmology

## 2019-04-03 DIAGNOSIS — H3581 Retinal edema: Secondary | ICD-10-CM

## 2019-04-03 DIAGNOSIS — I10 Essential (primary) hypertension: Secondary | ICD-10-CM | POA: Diagnosis not present

## 2019-04-03 DIAGNOSIS — H35341 Macular cyst, hole, or pseudohole, right eye: Secondary | ICD-10-CM | POA: Diagnosis not present

## 2019-04-03 DIAGNOSIS — H35033 Hypertensive retinopathy, bilateral: Secondary | ICD-10-CM

## 2019-04-03 DIAGNOSIS — H25813 Combined forms of age-related cataract, bilateral: Secondary | ICD-10-CM

## 2019-05-23 NOTE — Progress Notes (Signed)
[facility] Note  05/29/2019     CHIEF COMPLAINT Patient presents for Retina Follow Up   HISTORY OF PRESENT ILLNESS: Roy Young is a 64 y.o. male who presents to the clinic today for:   HPI    Retina Follow Up    Patient presents with  Other (Macular hole).  In right eye.  Severity is moderate.  Duration of 8 weeks.  Since onset it is gradually improving.  I, the attending physician,  performed the HPI with the patient and updated documentation appropriately.          Comments    Patient's caregiver states patient wears glasses more now than before. Thinks glasses may be helping with vision. Taking oxervate 6 times daily OD per [name] and latanoprost qhs OU per [name]. Not on valacyclovir--on hold per [name].        Last edited by Roy Caffey, MD on 05/29/2019 10:42 AM. (History)    pt is with his caregiver today, she states he last saw [name] on March 2 and was given latanoprost OU, pt has not seen [name] since he was here last, pt is on Oxervate 6x/day, caregiver states he is on week 5 and has 3 weeks left with that drop   Referring physician: [name], Roy, [address],  [place] [address]  HISTORICAL INFORMATION:   Selected notes from the [facility] Referred by Dr. [name] Young for concern of ERM with mac hole OD   CURRENT MEDICATIONS: Current Outpatient Medications (Ophthalmic Drugs)  Medication Sig  . latanoprost (XALATAN) 0.005 % ophthalmic solution Place 1 drop into both eyes at bedtime.  Marland Kitchen atropine 1 % ophthalmic solution Place 1 drop into the right eye daily.   . brimonidine (ALPHAGAN) 0.2 % ophthalmic solution Place 1 drop into the right eye daily.  Marland Kitchen gatifloxacin (ZYMAXID) 0.5 % SOLN Place 1 drop into the right eye 4 (four) times daily. (Patient not taking: Reported on 05/29/2019)  . neomycin-bacitracin-polymyxin (NEOSPORIN) ophthalmic ointment Place 1 application  into the right eye every 2 (two) hours. If contact lens come out, use every 2 hours (Patient not taking: Reported on 05/29/2019)  . prednisoLONE acetate (PRED FORTE) 1 % ophthalmic suspension Place 1 drop into the right eye every 2 (two) hours while awake. (Patient not taking: Reported on 05/29/2019)   No current facility-administered medications for this visit. (Ophthalmic Drugs)   Current Outpatient Medications (Other)  Medication Sig  . carbamazepine (CARBATROL) 300 MG 12 hr capsule Take 300-600 mg by mouth See admin instructions. Take 300 mg in the morning and 600 mg in the evening  . DEPAKOTE ER 500 MG 24 hr tablet   . divalproex (DEPAKOTE ER) 250 MG 24 hr tablet Take 250-750 mg by mouth See admin instructions. Take 500 mg in the morning, 750 mg Mid-day and in the evening  . ergocalciferol (VITAMIN D2) 50000 units capsule Take 50,000 Units by mouth once a week.  . Lacosamide (VIMPAT) 100 MG TABS Take 100 mg by mouth 2 (two) times daily.  Marland Kitchen levETIRAcetam (KEPPRA) 500 MG tablet Take 500 mg by mouth 2 (two) times daily.  Marland Kitchen loratadine (CLARITIN) 10 MG tablet Take 10 mg by mouth daily.  Marland Kitchen lovastatin (MEVACOR) 40 MG tablet Take 80 mg by mouth every evening.   . Multiple Vitamin (MULTI-VITAMIN) tablet Take 1 tablet by mouth daily. Chewable  . Multiple Vitamins-Minerals (MULTIVITAMIN ADULT PO) Take by mouth.  Marland Kitchen  propranolol (INDERAL) 10 MG tablet Take 10 mg by mouth 2 (two) times daily.   . sertraline (ZOLOFT) 50 MG tablet   . sertraline (ZOLOFT) 100 MG tablet Take 100 mg by mouth at bedtime.    No current facility-administered medications for this visit. (Other)      REVIEW OF SYSTEMS: ROS    Positive for: Endocrine, Eyes   Negative for: Constitutional, Gastrointestinal, Neurological, Skin, Genitourinary, Musculoskeletal, HENT, Cardiovascular, Respiratory, Psychiatric, Allergic/Imm, Heme/Lymph   Last edited by [name], COT on 05/29/2019  9:35 AM. (History)       ALLERGIES No  Known Allergies  PAST MEDICAL HISTORY Past Medical History:  Diagnosis Date  . Cataract    OU  . Developmental delay   . Diabetes mellitus without complication ([place])    not on medication; diet-controlled  . Hypertensive retinopathy    OU  . Seizures ([place])   . Sleep apnea    Past Surgical History:  Procedure Laterality Date  . [place] VITRECTOMY WITH 20 GAUGE MVR PORT FOR MACULAR HOLE Right 12/29/2018   Procedure: 25 GAUGE PARS PLANA VITRECTOMY;  Surgeon: Roy Caffey, MD;  Location: [facility];  Service: Ophthalmology;  Laterality: Right;    FAMILY HISTORY History reviewed. No pertinent family history.  SOCIAL HISTORY Social History   Tobacco Use  . Smoking status: Never Smoker  . Smokeless tobacco: Never Used  Substance Use Topics  . Alcohol use: No    Alcohol/week: 0.0 standard drinks  . Drug use: Never         OPHTHALMIC EXAM:  Base Eye Exam    Visual Acuity (Snellen - Linear)      Right Left   Dist cc 20/800 20/30 -2   Dist ph cc NI NI   Correction: Glasses       Tonometry (Tonopen, 9:48 AM)      Right Left   Pressure 19 20       Pupils      Dark Light Shape React APD   Right 5 4 Round Minimal None   Left 4 3 Round Minimal None       Visual Fields (Counting fingers)      Left Right    Full Full       Extraocular Movement      Right Left    Full, Ortho Full, Ortho       Neuro/Psych    Oriented x3: Yes   Mood/Affect: Normal       Dilation    Both eyes: 1.0% Mydriacyl, 2.5% Phenylephrine @ 9:48 AM        Slit Lamp and Fundus Exam    Slit Lamp Exam      Right Left   Lids/Lashes Dermatochalasis - upper lid, mild Meibomian gland dysfunction Dermatochalasis - upper lid, mild Meibomian gland dysfunction   Conjunctiva/Sclera White and quiet White and quiet, mild melanosis   Cornea central corneal haze, irreguler epi, no epi defect, 2+ Punctate epithelial erosions 1+Punctate epithelial erosions   Anterior Chamber Deep, clear Deep  and quiet   Iris Round and dilated Round and dilated   Lens 3-4+ Nuclear sclerosis with brunescence, 3+ Cortical cataract 3+ Nuclear sclerosis with early brunescence, 2+ Cortical cataract   Vitreous post vitrectomy Mild Vitreous syneresis       Fundus Exam      Right Left   Disc Mild Pallor, Sharp rim Mild Pallor, Sharp rim   C/D Ratio 0.5 0.5   Macula Flat,  Blunted foveal reflex, mac hole closed, mild Retinal pigment epithelial mottling Flat, Good foveal reflex, Retinal pigment epithelial mottling, No heme or edema   Vessels Vascular attenuation, Tortuousity Vascular attenuation, Tortuousity   Periphery Attached, No heme  Attached, No heme         Refraction    Wearing Rx      Sphere Cylinder Axis Add   Right -0.25 +1.25 013 +2.25   Left -0.75 +1.00 170 +2.25   Type: Bifocal          IMAGING AND PROCEDURES  Imaging and Procedures for _0 @  OCT, Retina - OU - Both Eyes       Right Eye Quality was borderline. Central Foveal Thickness: 257. Progression has improved. Findings include normal foveal contour, no IRF, outer retinal atrophy, no SRF (Mac hole closed; normal foveal profile; mild interval improvement in ORA; trace ERM).   Left Eye Quality was good. Central Foveal Thickness: 251. Progression has been stable. Findings include normal foveal contour, no IRF, no SRF, vitreomacular adhesion .   Notes *Images captured and stored on drive  Diagnosis / Impression:  OD: Mac hole closed; normal foveal profile; mild interval improvement in ORA; trace ERM OS: NFP, no IRF/SRF, VMA  Clinical management:  See below  Abbreviations: NFP - Normal foveal profile. CME - cystoid macular edema. PED - pigment epithelial detachment. IRF - intraretinal fluid. SRF - subretinal fluid. EZ - ellipsoid zone. ERM - epiretinal membrane. ORA - outer retinal atrophy. ORT - outer retinal tubulation. SRHM - subretinal hyper-reflective material                 ASSESSMENT/PLAN:     ICD-10-CM   1. Macular hole of right eye  H35.341   2. Retinal edema  H35.81 OCT, Retina - OU - Both Eyes  3. Essential hypertension  I10   4. Hypertensive retinopathy of both eyes  H35.033   5. Combined forms of age-related cataract of both eyes  H25.813     1,2. Full thickness mac hole OD  - pt with mental retardation and lives in a group home of 6 residents and 3 care givers  - s/p PPV/ICG/MP/14% C3F8 OD, 10.15.20             - mac hole closed and retina attached             - IOP 19  - now under the expert management of [name] for management of diabetic neurotrophic cornea  - no epi defect today and cornea improving -- central haze and irregular epithelium improving  - Oxervate 6x/day per [name]  - valacyclovir 1070m TID d/c'd per Dr. SLucita Ferrara - f/u 6 months for recheck of mac hole repair OD  3,4. Hypertensive retinopathy OU  - discussed importance of tight BP control  - monitor  5. Mixed form age related cataract OU  - The symptoms of cataract, surgical options, and treatments and risks were discussed with patient.  - discussed diagnosis and progression  - clear from a retina standpoint to proceed with cataract surgery when pt and surgeon are ready    Ophthalmic Meds Ordered this visit:  No orders of the defined types were placed in this encounter.      Return in about 6 months (around 11/29/2019) for f/u FWoodsOD, DFE, OCT.  There are no Patient Instructions on file for this visit.   Explained the diagnoses, plan, and follow up with the patient and they expressed understanding.  Patient expressed understanding of the importance of proper follow up care.   This document serves as a record of services personally performed by Gardiner Sleeper, MD, PhD. It was created on their behalf by Leeann Must, [place], a certified ophthalmic assistant. The creation of this record is the provider's dictation and/or activities during the visit.    Electronically  signed by: Leeann Must, COA _0 @ 4:47 PM   This document serves as a record of services personally performed by Gardiner Sleeper, MD, PhD. It was created on their behalf by Ernest Mallick, OA, an ophthalmic assistant. The creation of this record is the provider's dictation and/or activities during the visit.    Electronically signed by: Ernest Mallick, OA 03.15.2021 4:47 PM   Gardiner Sleeper, M.D., Ph.D. Diseases & Surgery of the Retina and Vitreous [facility]  I have reviewed the above documentation for accuracy and completeness, and I agree with the above. Gardiner Sleeper, M.D., Ph.D. 05/29/19 4:47 PM    Abbreviations: M myopia (nearsighted); A astigmatism; H hyperopia (farsighted); P presbyopia; Mrx spectacle prescription;  CTL contact lenses; OD right eye; OS left eye; OU both eyes  XT exotropia; ET esotropia; PEK punctate epithelial keratitis; PEE punctate epithelial erosions; DES dry eye syndrome; MGD meibomian gland dysfunction; ATs artificial tears; PFAT's preservative free artificial tears; [place] nuclear sclerotic cataract; PSC posterior subcapsular cataract; ERM epi-retinal membrane; PVD posterior vitreous detachment; RD retinal detachment; DM diabetes mellitus; DR diabetic retinopathy; NPDR non-proliferative diabetic retinopathy; PDR proliferative diabetic retinopathy; CSME clinically significant macular edema; DME diabetic macular edema; dbh dot blot hemorrhages; CWS cotton wool spot; POAG primary open angle glaucoma; C/D cup-to-disc ratio; HVF humphrey visual field; GVF goldmann visual field; OCT optical coherence tomography; IOP intraocular pressure; BRVO Branch retinal vein occlusion; CRVO central retinal vein occlusion; CRAO central retinal artery occlusion; BRAO branch retinal artery occlusion; RT retinal tear; SB scleral buckle; PPV pars plana vitrectomy; VH Vitreous hemorrhage; PRP panretinal laser photocoagulation; IVK intravitreal kenalog; VMT vitreomacular  traction; MH Macular hole;  NVD neovascularization of the disc; NVE neovascularization elsewhere; AREDS age related eye disease study; ARMD age related macular degeneration; POAG primary open angle glaucoma; EBMD epithelial/anterior basement membrane dystrophy; ACIOL anterior chamber intraocular lens; IOL intraocular lens; PCIOL posterior chamber intraocular lens; Phaco/IOL phacoemulsification with intraocular lens placement; [place] photorefractive keratectomy; LASIK laser assisted in situ keratomileusis; HTN hypertension; DM diabetes mellitus; COPD chronic obstructive pulmonary disease

## 2019-05-24 ENCOUNTER — Ambulatory Visit (INDEPENDENT_AMBULATORY_CARE_PROVIDER_SITE_OTHER): Payer: Medicare HMO | Admitting: Adult Health

## 2019-05-24 ENCOUNTER — Other Ambulatory Visit: Payer: Self-pay

## 2019-05-24 ENCOUNTER — Encounter: Payer: Self-pay | Admitting: Adult Health

## 2019-05-24 DIAGNOSIS — G4733 Obstructive sleep apnea (adult) (pediatric): Secondary | ICD-10-CM | POA: Diagnosis not present

## 2019-05-24 NOTE — Progress Notes (Signed)
@Patient  ID: Roy Young, male    DOB: [birth year], 64 y.o.   MRN: [id number]  Chief Complaint  Patient presents with  . Follow-up    OSA     Referring provider: [name], MD  HPI: 64 year old male with seizure disorder and mild MR followed for obstructive sleep apnea on nocturnal CPAP Lives at a group home  TEST/EVENTS :   05/24/2019 Follow up : OSA  Patient returns today for a 1 year follow-up for sleep apnea.  Patient is on nocturnal CPAP.  Patient is a resident of a group home.  His group home caregiver accompanies him today.  Caregiver says he is doing very well on CPAP.  He wears it every night and if he takes a nap he wears it during the daytime.  She says he is very compliant and does very well on his CPAP.  She has not noticed any issues with daytime sleepiness says he is very active during the daytime.  He has had no increased seizure activity.  CPAP download shows excellent compliance with 100% usage.  Daily average usage around 10 hr. positive leaks.  AHI not as good as last year at 13.7/hour  Has received his first Covid vaccine  No Known Allergies  Immunization History  Administered Date(s) Administered  . Influenza Split 03/27/2015, 12/14/2016  . Influenza,inj,Quad PF,6-35 Mos 12/15/2018    Past Medical History:  Diagnosis Date  . Cataract    OU  . Developmental delay   . Diabetes mellitus without complication ([place])    not on medication; diet-controlled  . Hypertensive retinopathy    OU  . Seizures ([place])   . Sleep apnea     Tobacco History: Social History   Tobacco Use  Smoking Status Never Smoker  Smokeless Tobacco Never Used   Counseling given: Not Answered   Outpatient Medications Prior to Visit  Medication Sig Dispense Refill  . atropine 1 % ophthalmic solution Place 1 drop into the right eye daily.     . brimonidine (ALPHAGAN) 0.2 % ophthalmic solution Place 1 drop into the right eye daily.    . carbamazepine (CARBATROL) 300 MG  12 hr capsule Take 300-600 mg by mouth See admin instructions. Take 300 mg in the morning and 600 mg in the evening    . DEPAKOTE ER 500 MG 24 hr tablet     . divalproex (DEPAKOTE ER) 250 MG 24 hr tablet Take 250-750 mg by mouth See admin instructions. Take 500 mg in the morning, 750 mg Mid-day and in the evening    . ergocalciferol (VITAMIN D2) 50000 units capsule Take 50,000 Units by mouth once a week.    Marland Kitchen gatifloxacin (ZYMAXID) 0.5 % SOLN Place 1 drop into the right eye 4 (four) times daily. 2.5 mL 4  . Lacosamide (VIMPAT) 100 MG TABS Take 100 mg by mouth 2 (two) times daily.    Marland Kitchen levETIRAcetam (KEPPRA) 500 MG tablet Take 500 mg by mouth 2 (two) times daily.    Marland Kitchen loratadine (CLARITIN) 10 MG tablet Take 10 mg by mouth daily.    Marland Kitchen lovastatin (MEVACOR) 40 MG tablet Take 80 mg by mouth every evening.     . Multiple Vitamin (MULTI-VITAMIN) tablet Take 1 tablet by mouth daily. Chewable    . Multiple Vitamins-Minerals (MULTIVITAMIN ADULT PO) Take by mouth.    . neomycin-bacitracin-polymyxin (NEOSPORIN) ophthalmic ointment Place 1 application into the right eye every 2 (two) hours. If contact lens come out, use every 2 hours  3.5 g 3  . prednisoLONE acetate (PRED FORTE) 1 % ophthalmic suspension Place 1 drop into the right eye every 2 (two) hours while awake. 15 mL 0  . propranolol (INDERAL) 10 MG tablet Take 10 mg by mouth 2 (two) times daily.     . sertraline (ZOLOFT) 100 MG tablet Take 100 mg by mouth at bedtime.     . sertraline (ZOLOFT) 50 MG tablet      No facility-administered medications prior to visit.     Review of Systems: : Non verbal, information from caregiver  Constitutional:   No  weight loss, night sweats,  Fevers, chills, fatigue, or  lassitude.  HEENT:   No headaches,  Difficulty swallowing,  Tooth/dental problems, or  Sore throat,                No sneezing, itching, ear ache, nasal congestion, post nasal drip,   CV:  No chest pain,  Orthopnea, PND, swelling in lower  extremities, anasarca, dizziness, palpitations, syncope.   GI  No heartburn, indigestion, abdominal pain, nausea, vomiting, diarrhea, change in bowel habits, loss of appetite, bloody stools.   Resp: No shortness of breath with exertion or at rest.  No excess mucus, no productive cough,  No non-productive cough,  No coughing up of blood.  No change in color of mucus.  No wheezing.  No chest wall deformity  Skin: no rash or lesions.  GU: no dysuria, change in color of urine, no urgency or frequency.  No flank pain, no hematuria   MS:  No joint pain or swelling.  No decreased range of motion.  No back pain.    Physical Exam  BP (!) 145/68 (BP Location: Left Arm, Cuff Size: Normal)   Pulse 75   Temp 98 F (36.7 C) (Temporal)   Ht 5\' 8"  (1.727 m)   Wt 193 lb 12.8 oz (87.9 kg)   SpO2 97% Comment: RA  BMI 29.47 kg/m   GEN: A/Ox3; pleasant , NAD, well nourished    HEENT:  [place]/AT,   NOSE-clear, THROAT-clear, no lesions, no postnasal drip or exudate noted.   NECK:  Supple w/ fair ROM; no JVD; normal carotid impulses w/o bruits; no thyromegaly or nodules palpated; no lymphadenopathy.    RESP  Clear  P & A; w/o, wheezes/ rales/ or rhonchi. no accessory muscle use, no dullness to percussion  CARD:  RRR, no m/r/g, no peripheral edema, pulses intact, no cyanosis or clubbing.  GI:   Soft & nt; nml bowel sounds; no organomegaly or masses detected.   Musco: Warm bil, no deformities or joint swelling noted.   Neuro: alert, no focal deficits noted.    Skin: Warm, no lesions or rashes    Lab Results:  CBC    Component Value Date/Time   HGB 14.7 12/29/2018 0906    BMET    Component Value Date/Time   NA 132 (L) 12/29/2018 0906   K 4.8 12/29/2018 0906   CL 98 12/29/2018 0906   CO2 25 12/29/2018 0906   GLUCOSE 97 12/29/2018 0906   BUN 7 (L) 12/29/2018 0906   CREATININE 0.83 12/29/2018 0906   CALCIUM 9.2 12/29/2018 0906   GFRNONAA >60 12/29/2018 0906   GFRAA >60 12/29/2018 0906     BNP No results found for: BNP  ProBNP No results found for: PROBNP  Imaging: No results found.    No flowsheet data found.  No results found for: NITRICOXIDE      Assessment & Plan:  OSA (obstructive sleep apnea) Patient has excellent compliance on CPAP.  Suspect his increased events are due to mask leaks.  Have suggested that caregiver change his mask more frequently to help with leaks.   Plan  Patient Instructions  Continue on CPAP At bedtime   Keep up good work.  Work on healthy weight .  Follow up with Dr. Elsworth Soho  In 1 year and As needed        Total patient care time 22 minutes  Chiniqua Kilcrease, NP 05/24/2019

## 2019-05-24 NOTE — Patient Instructions (Signed)
Continue on CPAP At bedtime   Keep up good work  Work on healthy weight .  Follow up with Dr. Alva  In 1 year and As needed   

## 2019-05-24 NOTE — Assessment & Plan Note (Signed)
Patient has excellent compliance on CPAP.  Suspect his increased events are due to mask leaks.  Have suggested that caregiver change his mask more frequently to help with leaks.   Plan  Patient Instructions  Continue on CPAP At bedtime   Keep up good work.  Work on healthy weight .  Follow up with Dr. Elsworth Soho  In 1 year and As needed

## 2019-05-29 ENCOUNTER — Ambulatory Visit (INDEPENDENT_AMBULATORY_CARE_PROVIDER_SITE_OTHER): Payer: Medicare HMO | Admitting: Ophthalmology

## 2019-05-29 ENCOUNTER — Encounter (INDEPENDENT_AMBULATORY_CARE_PROVIDER_SITE_OTHER): Payer: Self-pay | Admitting: Ophthalmology

## 2019-05-29 DIAGNOSIS — H3581 Retinal edema: Secondary | ICD-10-CM

## 2019-05-29 DIAGNOSIS — H25813 Combined forms of age-related cataract, bilateral: Secondary | ICD-10-CM

## 2019-05-29 DIAGNOSIS — H35033 Hypertensive retinopathy, bilateral: Secondary | ICD-10-CM | POA: Diagnosis not present

## 2019-05-29 DIAGNOSIS — H35341 Macular cyst, hole, or pseudohole, right eye: Secondary | ICD-10-CM | POA: Diagnosis not present

## 2019-05-29 DIAGNOSIS — I10 Essential (primary) hypertension: Secondary | ICD-10-CM

## 2019-06-16 ENCOUNTER — Other Ambulatory Visit (INDEPENDENT_AMBULATORY_CARE_PROVIDER_SITE_OTHER): Payer: Self-pay | Admitting: Ophthalmology

## 2019-11-27 NOTE — Progress Notes (Signed)
[facility] Note  11/29/2019     CHIEF COMPLAINT Patient presents for Retina Follow Up   HISTORY OF PRESENT ILLNESS: Roy Young is a 64 y.o. male who presents to the clinic today for:   HPI    Retina Follow Up    Patient presents with  Other.  In right eye.  This started 6 months ago.  Severity is moderate.  I, the attending physician,  performed the HPI with the patient and updated documentation appropriately.          Comments    Patient here for 6 months retina follow up for [place] OD. Patient states vision OD can't hardly see. Feels getting too much eye drops. No eye pain. Not complaining about OS. Has a new RX for glasses from [name].       Last edited by [name], MD on 11/29/2019  1:17 PM. (History)    pt is with his caregiver today, pt last saw [name] in May 2021, he completed his treatment of Oxervate and [name] did not recommend any additional treatments, his caregiver states he is complaining about not being able to see out of his right eye, pt has another appt with [name] in October, he is using AT's during the day and latanoprost at night  Referring physician: [name], [name], [place],   [id number]  HISTORICAL INFORMATION:   Selected notes from the [facility] Referred by Dr. [name] for concern of ERM with mac hole OD   CURRENT MEDICATIONS: Current Outpatient Medications (Ophthalmic Drugs)  Medication Sig   atropine 1 % ophthalmic solution Place 1 drop into the right eye daily.    brimonidine (ALPHAGAN) 0.2 % ophthalmic solution Place 1 drop into the right eye daily.   gatifloxacin (ZYMAXID) 0.5 % SOLN Place 1 drop into the right eye 4 (four) times daily. (Patient not taking: Reported on 05/29/2019)   latanoprost (XALATAN) 0.005 % ophthalmic solution Place 1 drop into both eyes at bedtime.   neomycin-bacitracin-polymyxin (NEOSPORIN) ophthalmic  ointment Place 1 application into the right eye every 2 (two) hours. If contact lens come out, use every 2 hours (Patient not taking: Reported on 05/29/2019)   prednisoLONE acetate (PRED FORTE) 1 % ophthalmic suspension Place 1 drop into the right eye every 2 (two) hours while awake. (Patient not taking: Reported on 05/29/2019)   No current facility-administered medications for this visit. (Ophthalmic Drugs)   Current Outpatient Medications (Other)  Medication Sig   carbamazepine (CARBATROL) 300 MG 12 hr capsule Take 300-600 mg by mouth See admin instructions. Take 300 mg in the morning and 600 mg in the evening   DEPAKOTE ER 500 MG 24 hr tablet    divalproex (DEPAKOTE ER) 250 MG 24 hr tablet Take 250-750 mg by mouth See admin instructions. Take 500 mg in the morning, 750 mg Mid-day and in the evening   ergocalciferol (VITAMIN D2) 50000 units capsule Take 50,000 Units by mouth once a week.   Lacosamide (VIMPAT) 100 MG TABS Take 100 mg by mouth 2 (two) times daily.   levETIRAcetam (KEPPRA) 500 MG tablet Take 500 mg by mouth 2 (two) times daily.   loratadine (CLARITIN) 10 MG tablet Take 10 mg by mouth daily.   lovastatin (MEVACOR) 40 MG tablet Take 80 mg by mouth every evening.    Multiple Vitamin (MULTI-VITAMIN) tablet Take 1 tablet by mouth daily. Chewable   Multiple Vitamins-Minerals (MULTIVITAMIN ADULT  PO) Take by mouth.   propranolol (INDERAL) 10 MG tablet Take 10 mg by mouth 2 (two) times daily.    sertraline (ZOLOFT) 100 MG tablet Take 100 mg by mouth at bedtime.    sertraline (ZOLOFT) 50 MG tablet    No current facility-administered medications for this visit. (Other)      REVIEW OF SYSTEMS: ROS    Positive for: Endocrine, Eyes   Negative for: Constitutional, Gastrointestinal, Neurological, Skin, Genitourinary, Musculoskeletal, HENT, Cardiovascular, Respiratory, Psychiatric, Allergic/Imm, Heme/Lymph   Last edited by Theodore Demark, COA on 11/29/2019  9:29 AM.  (History)       ALLERGIES No Known Allergies  PAST MEDICAL HISTORY Past Medical History:  Diagnosis Date   Cataract    OU   Developmental delay    Diabetes mellitus without complication ([place])    not on medication; diet-controlled   Hypertensive retinopathy    OU   Seizures ([place])    Sleep apnea    Past Surgical History:  Procedure Laterality Date   25 GAUGE PARS PLANA VITRECTOMY WITH 20 GAUGE MVR PORT FOR MACULAR HOLE Right 12/29/2018   Procedure: 25 [place];  Surgeon: [name], MD;  Location: [facility];  Service: Ophthalmology;  Laterality: Right;    FAMILY HISTORY History reviewed. No pertinent family history.  SOCIAL HISTORY Social History   Tobacco Use   Smoking status: Never Smoker   Smokeless tobacco: Never Used  Vaping Use   Vaping Use: Never used  Substance Use Topics   Alcohol use: No    Alcohol/week: 0.0 standard drinks   Drug use: Never         OPHTHALMIC EXAM:  Base Eye Exam    Visual Acuity (Snellen - Linear)      Right Left   Dist cc 20/400 -1 20/40 -2   Dist ph cc NI NI   Correction: Glasses       Tonometry (Tonopen, 9:26 AM)      Right Left   Pressure 19 19       Pupils      Dark Light Shape React APD   Right 5 4 Round Minimal None   Left 4 3 Round Minimal None       Visual Fields (Counting fingers)      Left Right    Full Full       Extraocular Movement      Right Left    Full, Ortho Full, Ortho       Neuro/Psych    Oriented x3: Yes   Mood/Affect: Normal       Dilation    Both eyes: 1.0% Mydriacyl, 2.5% Phenylephrine @ 9:26 AM        Slit Lamp and Fundus Exam    Slit Lamp Exam      Right Left   Lids/Lashes Dermatochalasis - upper lid, mild Meibomian gland dysfunction Dermatochalasis - upper lid, mild Meibomian gland dysfunction   Conjunctiva/Sclera White and quiet White and quiet, mild melanosis   Cornea central corneal haze, no epi defect, 1+ Punctate epithelial erosions  1-2+Punctate epithelial erosions   Anterior Chamber Deep, clear Deep and quiet   Iris Round and dilated Round and dilated   Lens 4+ Nuclear sclerosis with brunescence, 3+ Cortical cataract, 2+ Posterior subcapsular cataract 2+ Nuclear sclerosis with early brunescence, 2+ Cortical cataract   Vitreous post vitrectomy Mild Vitreous syneresis       Fundus Exam      Right Left   Disc  hazy view Mild Pallor, Sharp rim, Compact Mild Pallor, Sharp rim, focal PPP   C/D Ratio 0.5 0.5   Macula Hazy view Flat, Blunted foveal reflex, mac hole closed, mild Retinal pigment epithelial mottling Flat, Good foveal reflex, Retinal pigment epithelial mottling, No heme or edema   Vessels Vascular attenuation Vascular attenuation, mild Tortuousity, mild AV crossing changes   Periphery Attached, No heme  Attached, No heme         Refraction    Wearing Rx      Sphere Cylinder Axis Add   Right -0.25 +1.25 013 +2.25   Left -0.75 +1.00 170 +2.25   Type: Bifocal          IMAGING AND PROCEDURES  Imaging and Procedures for _0 @  OCT, Retina - OU - Both Eyes       Right Eye Quality was poor. Progression has been stable. Findings include normal foveal contour, no IRF, outer retinal atrophy, no SRF (Very hazy image, Mac hole closed; normal foveal profile; persistent ORA; trace ERM -- unable to compare to prior).   Left Eye Quality was good. Central Foveal Thickness: 251. Progression has been stable. Findings include normal foveal contour, no IRF, no SRF, vitreomacular adhesion .   Notes *Images captured and stored on drive  Diagnosis / Impression:  OD: Very hazy images, Mac hole closed; normal foveal profile; persistent ORA; trace ERM -- unable to compare to prior OS: NFP, no IRF/SRF, VMA  Clinical management:  See below  Abbreviations: NFP - Normal foveal profile. CME - cystoid macular edema. PED - pigment epithelial detachment. IRF - intraretinal fluid. SRF - subretinal fluid. EZ - ellipsoid  zone. ERM - epiretinal membrane. ORA - outer retinal atrophy. ORT - outer retinal tubulation. SRHM - subretinal hyper-reflective material                 ASSESSMENT/PLAN:    ICD-10-CM   1. Macular hole of right eye  H35.341   2. Retinal edema  H35.81 OCT, Retina - OU - Both Eyes  3. Essential hypertension  I10   4. Hypertensive retinopathy of both eyes  H35.033   5. Combined forms of age-related cataract of both eyes  H25.813     1,2. Full thickness mac hole OD  - pt with mental retardation and lives in a group home of 6 residents and 3 care givers  - s/p PPV/ICG/MP/14% C3F8 OD, 10.15.20             - mac hole closed and retina attached             - IOP 19  - under the expert management of [name] for management of diabetic neurotrophic cornea  - no epi defect today and cornea improving -- epithelium intact/improved; central haze persists but improved from prior  - Oxervate 6x/day per [name] -- pt completed treatment course  - f/u 1 year for recheck of mac hole repair OD  3,4. Hypertensive retinopathy OU  - discussed importance of tight BP control  - monitor  5. Mixed form age related cataract OU  - The symptoms of cataract, surgical options, and treatments and risks were discussed with patient.  - discussed diagnosis and progression  - OD with significant cataract post vitrectomy -- being monitored by [name]  - clear from a retinal standpoint when patient and surgeon are ready  - pt has f/u scheduled with [name] in October    [place] Ordered this visit:  No orders of the defined types were placed in this encounter.      Return in about 1 year (around 11/28/2020) for f/u mac hole repair OD, DFE, OCT.  There are no Patient Instructions on file for this visit.   Explained the diagnoses, plan, and follow up with the patient and they expressed understanding.  Patient expressed understanding of the importance of proper  follow up care.   This document serves as a record of services personally performed by Gardiner Sleeper, MD, PhD. It was created on their behalf by Roselee Nova, COMT. The creation of this record is the provider's dictation and/or activities during the visit.  Electronically signed by: Roselee Nova, COMT 11/29/19 1:21 PM  This document serves as a record of services personally performed by Gardiner Sleeper, MD, PhD. It was created on their behalf by San Jetty. Owens Shark, OA an ophthalmic technician. The creation of this record is the provider's dictation and/or activities during the visit.    Electronically signed by: San Jetty. Owens Shark, [place] 09.15.2021 1:21 PM  Gardiner Sleeper, M.D., Ph.D. Diseases & Surgery of the Retina and Vitreous [facility]  I have reviewed the above documentation for accuracy and completeness, and I agree with the above. Gardiner Sleeper, M.D., Ph.D. 11/29/19 1:21 PM   Abbreviations: M myopia (nearsighted); A astigmatism; H hyperopia (farsighted); P presbyopia; Mrx spectacle prescription;  CTL contact lenses; OD right eye; OS left eye; OU both eyes  XT exotropia; ET esotropia; PEK punctate epithelial keratitis; PEE punctate epithelial erosions; DES dry eye syndrome; MGD meibomian gland dysfunction; ATs artificial tears; PFAT's preservative free artificial tears; [place] nuclear sclerotic cataract; PSC posterior subcapsular cataract; ERM epi-retinal membrane; PVD posterior vitreous detachment; RD retinal detachment; DM diabetes mellitus; DR diabetic retinopathy; NPDR non-proliferative diabetic retinopathy; PDR proliferative diabetic retinopathy; CSME clinically significant macular edema; DME diabetic macular edema; dbh dot blot hemorrhages; CWS cotton wool spot; POAG primary open angle glaucoma; C/D cup-to-disc ratio; HVF humphrey visual field; GVF goldmann visual field; OCT optical coherence tomography; IOP intraocular pressure; BRVO Branch retinal vein occlusion;  CRVO central retinal vein occlusion; CRAO central retinal artery occlusion; BRAO branch retinal artery occlusion; RT retinal tear; SB scleral buckle; PPV pars plana vitrectomy; VH Vitreous hemorrhage; PRP panretinal laser photocoagulation; IVK intravitreal kenalog; VMT vitreomacular traction; MH Macular hole;  NVD neovascularization of the disc; NVE neovascularization elsewhere; AREDS age related eye disease study; ARMD age related macular degeneration; POAG primary open angle glaucoma; EBMD epithelial/anterior basement membrane dystrophy; ACIOL anterior chamber intraocular lens; IOL intraocular lens; PCIOL posterior chamber intraocular lens; Phaco/IOL phacoemulsification with intraocular lens placement; [place] photorefractive keratectomy; LASIK laser assisted in situ keratomileusis; HTN hypertension; DM diabetes mellitus; COPD chronic obstructive pulmonary disease

## 2019-11-29 ENCOUNTER — Ambulatory Visit (INDEPENDENT_AMBULATORY_CARE_PROVIDER_SITE_OTHER): Payer: Medicare HMO | Admitting: Ophthalmology

## 2019-11-29 ENCOUNTER — Encounter (INDEPENDENT_AMBULATORY_CARE_PROVIDER_SITE_OTHER): Payer: Self-pay | Admitting: Ophthalmology

## 2019-11-29 ENCOUNTER — Other Ambulatory Visit: Payer: Self-pay

## 2019-11-29 DIAGNOSIS — H35033 Hypertensive retinopathy, bilateral: Secondary | ICD-10-CM

## 2019-11-29 DIAGNOSIS — H25813 Combined forms of age-related cataract, bilateral: Secondary | ICD-10-CM

## 2019-11-29 DIAGNOSIS — I10 Essential (primary) hypertension: Secondary | ICD-10-CM | POA: Diagnosis not present

## 2019-11-29 DIAGNOSIS — H3581 Retinal edema: Secondary | ICD-10-CM | POA: Diagnosis not present

## 2019-11-29 DIAGNOSIS — H35341 Macular cyst, hole, or pseudohole, right eye: Secondary | ICD-10-CM

## 2020-02-29 ENCOUNTER — Other Ambulatory Visit: Payer: Self-pay

## 2020-02-29 ENCOUNTER — Ambulatory Visit (HOSPITAL_COMMUNITY)
Admission: EM | Admit: 2020-02-29 | Discharge: 2020-02-29 | Disposition: A | Discharge status: Home / Self Care | Payer: Medicare HMO | Attending: Family Medicine | Admitting: Family Medicine

## 2020-02-29 ENCOUNTER — Encounter (HOSPITAL_COMMUNITY): Payer: Self-pay

## 2020-02-29 DIAGNOSIS — J22 Unspecified acute lower respiratory infection: Secondary | ICD-10-CM | POA: Diagnosis not present

## 2020-02-29 MED ORDER — BENZONATATE 200 MG PO CAPS: 200.0000 mg | ORAL_CAPSULE | Freq: Two times a day (BID) | ORAL | 0 refills | Status: DC | PRN

## 2020-02-29 MED ORDER — AMOXICILLIN 875 MG PO TABS: 875.0000 mg | ORAL_TABLET | Freq: Two times a day (BID) | ORAL | 0 refills | Status: DC

## 2020-02-29 NOTE — ED Triage Notes (Signed)
Pt presents with non productive cough for past few days; pt caretaker believes it is from him having to use his CPAP machine a little more.

## 2020-02-29 NOTE — Discharge Instructions (Addendum)
Take antibiotic and cough pill 2 x a day for a week Plenty of fluids

## 2020-02-29 NOTE — ED Provider Notes (Signed)
[place]    CSN: [id number] Arrival date & time: 02/29/20  1124      History   Chief Complaint Chief Complaint  Patient presents with  . Cough    HPI Roy Young is a 64 y.o. male.   HPI This is a mentally impaired adult who lives in a group home.  He has a cough.  Its been going on for almost a week.  It appears to be getting worse.  He is cough sounds harsh.  He is unable to communicate all of his symptoms.  He has been more tired.  Has had a decreased appetite.  He is fully vaccinated for Covid and influenza.  No known infections in the household. Past Medical History:  Diagnosis Date  . Cataract    OU  . Developmental delay   . Diabetes mellitus without complication ([place])    not on medication; diet-controlled  . Hypertensive retinopathy    OU  . Seizures ([place])   . Sleep apnea     Patient Active Problem List   Diagnosis Date Noted  . Exertional dyspnea 07/17/2015  . OSA (obstructive sleep apnea) 07/17/2015  . Fatigue 02/01/2013  . Hypercholesterolemia 02/01/2013  . Seizure ([place]) 02/09/2012    Past Surgical History:  Procedure Laterality Date  . [place] VITRECTOMY WITH 20 GAUGE MVR PORT FOR MACULAR HOLE Right 12/29/2018   Procedure: 25 GAUGE PARS PLANA VITRECTOMY;  Surgeon: [name], MD;  Location: [place];  Service: Ophthalmology;  Laterality: Right;       Home Medications    Prior to Admission medications   Medication Sig Start Date End Date Taking? Authorizing Provider  amoxicillin (AMOXIL) 875 MG tablet Take 1 tablet (875 mg total) by mouth 2 (two) times daily. 02/29/20   Raylene Everts, MD  atropine 1 % ophthalmic solution Place 1 drop into the right eye daily.     [provider]  benzonatate (TESSALON) 200 MG capsule Take 1 capsule (200 mg total) by mouth 2 (two) times daily as needed for cough. 02/29/20   Raylene Everts, MD  brimonidine (ALPHAGAN) 0.2 % ophthalmic solution Place 1 drop into the  right eye daily.    [provider]  carbamazepine (CARBATROL) 300 MG 12 hr capsule Take 300-600 mg by mouth See admin instructions. Take 300 mg in the morning and 600 mg in the evening    [provider]  DEPAKOTE ER 500 MG 24 hr tablet  02/15/19   [provider]  divalproex (DEPAKOTE ER) 250 MG 24 hr tablet Take 250-750 mg by mouth See admin instructions. Take 500 mg in the morning, 750 mg Mid-day and in the evening    [provider]  ergocalciferol (VITAMIN D2) 50000 units capsule Take 50,000 Units by mouth once a week.    [provider]  Lacosamide (VIMPAT) 100 MG TABS Take 100 mg by mouth 2 (two) times daily.    [provider]  latanoprost (XALATAN) 0.005 % ophthalmic solution Place 1 drop into both eyes at bedtime. 05/17/19   [provider]  levETIRAcetam (KEPPRA) 500 MG tablet Take 500 mg by mouth 2 (two) times daily.    [provider]  loratadine (CLARITIN) 10 MG tablet Take 10 mg by mouth daily.    [provider]  lovastatin (MEVACOR) 40 MG tablet Take 80 mg by mouth every evening.     [provider]  Multiple Vitamins-Minerals (MULTIVITAMIN ADULT PO) Take  by mouth.    [provider]  propranolol (INDERAL) 10 MG tablet Take 10 mg by mouth 2 (two) times daily.     [provider]  sertraline (ZOLOFT) 100 MG tablet Take 100 mg by mouth at bedtime.     [provider]  sertraline (ZOLOFT) 50 MG tablet  02/15/19   [provider]    Family History History reviewed. No pertinent family history.  Social History Social History   Tobacco Use  . Smoking status: Never Smoker  . Smokeless tobacco: Never Used  Vaping Use  . Vaping Use: Never used  Substance Use Topics  . Alcohol use: No    Alcohol/week: 0.0 standard drinks  . Drug use: Never     Allergies   Patient has no known allergies.   Review of Systems Review of Systems See HPI  Physical  Exam Triage Vital Signs ED Triage Vitals  Enc Vitals Group     BP 02/29/20 1406 133/75     Pulse Rate 02/29/20 1406 94     Resp 02/29/20 1406 20     Temp 02/29/20 1406 97.9 F (36.6 C)     Temp Source 02/29/20 1406 Oral     SpO2 02/29/20 1406 100 %     Weight --      Height --      Head Circumference --      Peak Flow --      Pain Score 02/29/20 1404 0     Pain Loc --      Pain Edu? --      Excl. in [place]? --    No data found.  Updated Vital Signs BP 133/75 (BP Location: Right Arm)   Pulse 94   Temp 97.9 F (36.6 C) (Oral)   Resp 20   SpO2 100%      Physical Exam Constitutional:      General: He is not in acute distress.    Appearance: He is well-developed, normal weight and well-nourished.     Comments: Hard of hearing.  Understands basic commands  HENT:     Head: Normocephalic and atraumatic.     Right Ear: Tympanic membrane, ear canal and external ear normal.     Left Ear: Tympanic membrane, ear canal and external ear normal.     Nose: Congestion present.     Mouth/Throat:     Mouth: Oropharynx is clear and moist.     Pharynx: No posterior oropharyngeal erythema.  Eyes:     Conjunctiva/sclera: Conjunctivae normal.     Pupils: Pupils are equal, round, and reactive to light.  Cardiovascular:     Rate and Rhythm: Normal rate and regular rhythm.  Pulmonary:     Effort: Pulmonary effort is normal. No respiratory distress.     Breath sounds: Rhonchi present.  Abdominal:     General: There is no distension.     Palpations: Abdomen is soft.  Musculoskeletal:        General: No edema. Normal range of motion.     Cervical back: Normal range of motion.  Skin:    General: Skin is warm and dry.  Neurological:     Mental Status: He is alert.  Psychiatric:        Behavior: Behavior normal.      UC Treatments / Results  Labs (all labs ordered are listed, but only abnormal results are displayed) Labs Reviewed - No data to display  EKG   Radiology No results  found.  Procedures Procedures (including critical care time)  Medications Ordered in UC Medications - No data to display  Initial Impression / Assessment and Plan / UC Course  I have reviewed the triage vital signs and the nursing notes.  Pertinent labs & imaging results that were available during my care of the patient were reviewed by me and considered in my medical decision making (see chart for details).    Will cover him with an antibiotic because of his group home situation and developmental delay. Final Clinical Impressions(s) / UC Diagnoses   Final diagnoses:  LRTI (lower respiratory tract infection)     Discharge Instructions     Take antibiotic and cough pill 2 x a day for a week Plenty of fluids    ED Prescriptions    Medication Sig Dispense Auth. Provider   benzonatate (TESSALON) 200 MG capsule Take 1 capsule (200 mg total) by mouth 2 (two) times daily as needed for cough. 20 capsule Raylene Everts, MD   amoxicillin (AMOXIL) 875 MG tablet Take 1 tablet (875 mg total) by mouth 2 (two) times daily. 14 tablet Raylene Everts, MD     PDMP not reviewed this encounter.   Raylene Everts, MD 02/29/20 2119

## 2020-03-13 ENCOUNTER — Ambulatory Visit (HOSPITAL_COMMUNITY)
Admission: EM | Admit: 2020-03-13 | Discharge: 2020-03-13 | Disposition: A | Discharge status: Home / Self Care | Payer: Medicare HMO | Attending: Internal Medicine | Admitting: Internal Medicine

## 2020-03-13 DIAGNOSIS — R109 Unspecified abdominal pain: Secondary | ICD-10-CM | POA: Diagnosis not present

## 2020-03-13 MED ORDER — PANTOPRAZOLE SODIUM 20 MG PO TBEC: 20.0000 mg | DELAYED_RELEASE_TABLET | Freq: Every day | ORAL | 0 refills | Status: DC

## 2020-03-13 MED ORDER — ONDANSETRON 4 MG PO TBDP: 4.0000 mg | ORAL_TABLET | Freq: Three times a day (TID) | ORAL | 0 refills | Status: DC | PRN

## 2020-03-13 NOTE — Discharge Instructions (Signed)
Prune juice to help with bowel movement Give him medication as prescribed If he has persistent vomiting, abdominal pain or abdominal distention-please return to the urgent care for re-evaluation.

## 2020-03-13 NOTE — ED Provider Notes (Signed)
[place]    CSN: [id number] Arrival date & time: 03/13/20  1753      History   Chief Complaint No chief complaint on file.   HPI Roy Young is a 64 y.o. male with a history of developmental delay accompanied by his caregiver is brought to the urgent care on account of abdominal pain.  Patient is nonverbal.  The caregiver says over the past week oral intake has decreased.  Bowel movements are normal.  Patient is discharged that he has abdominal pain.  He has had 1 or 2 episodes of nonbloody nonbilious emesis.  No fever or chills.Marland Kitchen   HPI  Past Medical History:  Diagnosis Date  . Cataract    OU  . Developmental delay   . Diabetes mellitus without complication ([place])    not on medication; diet-controlled  . Hypertensive retinopathy    OU  . Seizures ([place])   . Sleep apnea     Patient Active Problem List   Diagnosis Date Noted  . Exertional dyspnea 07/17/2015  . OSA (obstructive sleep apnea) 07/17/2015  . Fatigue 02/01/2013  . Hypercholesterolemia 02/01/2013  . Seizure ([place]) 02/09/2012    Past Surgical History:  Procedure Laterality Date  . [place] VITRECTOMY WITH 20 GAUGE MVR PORT FOR MACULAR HOLE Right 12/29/2018   Procedure: 25 GAUGE PARS PLANA VITRECTOMY;  Surgeon: [name], MD;  Location: [place];  Service: Ophthalmology;  Laterality: Right;       Home Medications    Prior to Admission medications   Medication Sig Start Date End Date Taking? Authorizing Provider  ondansetron (ZOFRAN ODT) 4 MG disintegrating tablet Take 1 tablet (4 mg total) by mouth every 8 (eight) hours as needed for nausea or vomiting. 03/13/20  Yes Lizzet Hendley, Myrene Galas, MD  pantoprazole (PROTONIX) 20 MG tablet Take 1 tablet (20 mg total) by mouth daily. 03/13/20  Yes Malak Duchesneau, Myrene Galas, MD  amoxicillin (AMOXIL) 875 MG tablet Take 1 tablet (875 mg total) by mouth 2 (two) times daily. 02/29/20   Raylene Everts, MD  atropine 1 % ophthalmic solution Place 1 drop  into the right eye daily.     [provider]  benzonatate (TESSALON) 200 MG capsule Take 1 capsule (200 mg total) by mouth 2 (two) times daily as needed for cough. 02/29/20   Raylene Everts, MD  brimonidine (ALPHAGAN) 0.2 % ophthalmic solution Place 1 drop into the right eye daily.    [provider]  carbamazepine (CARBATROL) 300 MG 12 hr capsule Take 300-600 mg by mouth See admin instructions. Take 300 mg in the morning and 600 mg in the evening    [provider]  DEPAKOTE ER 500 MG 24 hr tablet  02/15/19   [provider]  divalproex (DEPAKOTE ER) 250 MG 24 hr tablet Take 250-750 mg by mouth See admin instructions. Take 500 mg in the morning, 750 mg Mid-day and in the evening    [provider]  ergocalciferol (VITAMIN D2) 50000 units capsule Take 50,000 Units by mouth once a week.    [provider]  Lacosamide (VIMPAT) 100 MG TABS Take 100 mg by mouth 2 (two) times daily.    [provider]  latanoprost (XALATAN) 0.005 % ophthalmic solution Place 1 drop into both eyes at bedtime. 05/17/19   [provider]  levETIRAcetam (KEPPRA) 500 MG tablet Take 500 mg by mouth 2 (two) times daily.    [provider]  loratadine (CLARITIN)  10 MG tablet Take 10 mg by mouth daily.    [provider]  lovastatin (MEVACOR) 40 MG tablet Take 80 mg by mouth every evening.     [provider]  Multiple Vitamins-Minerals (MULTIVITAMIN ADULT PO) Take by mouth.    [provider]  propranolol (INDERAL) 10 MG tablet Take 10 mg by mouth 2 (two) times daily.     [provider]  sertraline (ZOLOFT) 100 MG tablet Take 100 mg by mouth at bedtime.     [provider]  sertraline (ZOLOFT) 50 MG tablet  02/15/19   [provider]    Family History No family history on file.  Social History Social History   Tobacco Use  . Smoking status: Never Smoker  . Smokeless tobacco: Never Used   Vaping Use  . Vaping Use: Never used  Substance Use Topics  . Alcohol use: No    Alcohol/week: 0.0 standard drinks  . Drug use: Never     Allergies   Patient has no known allergies.   Review of Systems Review of Systems  Unable to perform ROS: Patient nonverbal     Physical Exam Triage Vital Signs ED Triage Vitals  Enc Vitals Group     BP      Pulse      Resp      Temp      Temp src      SpO2      Weight      Height      Head Circumference      Peak Flow      Pain Score      Pain Loc      Pain Edu?      Excl. in [place]?    No data found.  Updated Vital Signs There were no vitals taken for this visit.  Visual Acuity Right Eye Distance:   Left Eye Distance:   Bilateral Distance:    Right Eye Near:   Left Eye Near:    Bilateral Near:     Physical Exam Vitals and nursing note reviewed.  Constitutional:      General: He is not in acute distress.    Appearance: He is not ill-appearing.  Cardiovascular:     Rate and Rhythm: Normal rate and regular rhythm.     Pulses: Normal pulses.     Heart sounds: Normal heart sounds.  Pulmonary:     Effort: Pulmonary effort is normal.     Breath sounds: Normal breath sounds.  Abdominal:     General: Bowel sounds are normal.     Palpations: Abdomen is soft.  Neurological:     Mental Status: He is alert.      UC Treatments / Results  Labs (all labs ordered are listed, but only abnormal results are displayed) Labs Reviewed - No data to display  EKG   Radiology No results found.  Procedures Procedures (including critical care time)  Medications Ordered in UC Medications - No data to display  Initial Impression / Assessment and Plan / UC Course  I have reviewed the triage vital signs and the nursing notes.  Pertinent labs & imaging results that were available during my care of the patient were reviewed by me and considered in my medical decision making (see chart for details).     1.  Presumed  abdominal pain: Protonix Zofran as needed for nausea/vomiting Caregiver advised to give patient prune juice to help with constipation if any Patient's  abdominal exam is benign.  No abdominal distention, tenderness, guarding and bowel sounds are normal. Return precautions to ED was given. Final Clinical Impressions(s) / UC Diagnoses   Final diagnoses:  Abdominal pain, unspecified abdominal location     Discharge Instructions     Prune juice to help with bowel movement Give him medication as prescribed If he has persistent vomiting, abdominal pain or abdominal distention-please return to the urgent care for re-evaluation.   ED Prescriptions    Medication Sig Dispense Auth. Provider   pantoprazole (PROTONIX) 20 MG tablet Take 1 tablet (20 mg total) by mouth daily. 30 tablet Sheretha Shadd, Britta Mccreedy, MD   ondansetron (ZOFRAN ODT) 4 MG disintegrating tablet Take 1 tablet (4 mg total) by mouth every 8 (eight) hours as needed for nausea or vomiting. 20 tablet Edgar Reisz, Britta Mccreedy, MD     PDMP not reviewed this encounter.   Merrilee Jansky, MD 03/14/20 [id number]

## 2020-03-25 ENCOUNTER — Inpatient Hospital Stay (HOSPITAL_COMMUNITY)
Admission: EM | Admit: 2020-03-25 | Discharge: 2020-04-04 | DRG: 442 | Disposition: A | Discharge status: Hospice | Payer: Medicare HMO | Attending: Family Medicine | Admitting: Family Medicine

## 2020-03-25 ENCOUNTER — Encounter (HOSPITAL_COMMUNITY): Payer: Self-pay

## 2020-03-25 DIAGNOSIS — R932 Abnormal findings on diagnostic imaging of liver and biliary tract: Secondary | ICD-10-CM

## 2020-03-25 DIAGNOSIS — F819 Developmental disorder of scholastic skills, unspecified: Secondary | ICD-10-CM | POA: Diagnosis present

## 2020-03-25 DIAGNOSIS — C787 Secondary malignant neoplasm of liver and intrahepatic bile duct: Secondary | ICD-10-CM | POA: Diagnosis present

## 2020-03-25 DIAGNOSIS — F79 Unspecified intellectual disabilities: Secondary | ICD-10-CM | POA: Diagnosis present

## 2020-03-25 DIAGNOSIS — Z66 Do not resuscitate: Secondary | ICD-10-CM | POA: Diagnosis not present

## 2020-03-25 DIAGNOSIS — Z79899 Other long term (current) drug therapy: Secondary | ICD-10-CM

## 2020-03-25 DIAGNOSIS — R109 Unspecified abdominal pain: Secondary | ICD-10-CM | POA: Diagnosis present

## 2020-03-25 DIAGNOSIS — E46 Unspecified protein-calorie malnutrition: Secondary | ICD-10-CM | POA: Diagnosis present

## 2020-03-25 DIAGNOSIS — I81 Portal vein thrombosis: Principal | ICD-10-CM | POA: Diagnosis present

## 2020-03-25 DIAGNOSIS — G4730 Sleep apnea, unspecified: Secondary | ICD-10-CM | POA: Diagnosis present

## 2020-03-25 DIAGNOSIS — E8809 Other disorders of plasma-protein metabolism, not elsewhere classified: Secondary | ICD-10-CM | POA: Diagnosis present

## 2020-03-25 DIAGNOSIS — E119 Type 2 diabetes mellitus without complications: Secondary | ICD-10-CM | POA: Diagnosis present

## 2020-03-25 DIAGNOSIS — N179 Acute kidney failure, unspecified: Secondary | ICD-10-CM | POA: Diagnosis present

## 2020-03-25 DIAGNOSIS — I871 Compression of vein: Secondary | ICD-10-CM | POA: Diagnosis present

## 2020-03-25 DIAGNOSIS — E441 Mild protein-calorie malnutrition: Secondary | ICD-10-CM | POA: Diagnosis present

## 2020-03-25 DIAGNOSIS — R634 Abnormal weight loss: Secondary | ICD-10-CM | POA: Diagnosis present

## 2020-03-25 DIAGNOSIS — D490 Neoplasm of unspecified behavior of digestive system: Secondary | ICD-10-CM | POA: Diagnosis present

## 2020-03-25 DIAGNOSIS — R569 Unspecified convulsions: Secondary | ICD-10-CM | POA: Diagnosis present

## 2020-03-25 DIAGNOSIS — E7800 Pure hypercholesterolemia, unspecified: Secondary | ICD-10-CM | POA: Diagnosis present

## 2020-03-25 DIAGNOSIS — C249 Malignant neoplasm of biliary tract, unspecified: Secondary | ICD-10-CM | POA: Diagnosis present

## 2020-03-25 DIAGNOSIS — D684 Acquired coagulation factor deficiency: Secondary | ICD-10-CM | POA: Diagnosis present

## 2020-03-25 DIAGNOSIS — R160 Hepatomegaly, not elsewhere classified: Secondary | ICD-10-CM | POA: Diagnosis present

## 2020-03-25 DIAGNOSIS — G4733 Obstructive sleep apnea (adult) (pediatric): Secondary | ICD-10-CM | POA: Diagnosis present

## 2020-03-25 DIAGNOSIS — Z6825 Body mass index (BMI) 25.0-25.9, adult: Secondary | ICD-10-CM

## 2020-03-25 DIAGNOSIS — Z20822 Contact with and (suspected) exposure to covid-19: Secondary | ICD-10-CM | POA: Diagnosis present

## 2020-03-25 DIAGNOSIS — R066 Hiccough: Secondary | ICD-10-CM | POA: Diagnosis not present

## 2020-03-25 DIAGNOSIS — Z515 Encounter for palliative care: Secondary | ICD-10-CM

## 2020-03-25 LAB — COMPREHENSIVE METABOLIC PANEL
ALT: 84 U/L — ABNORMAL HIGH (ref 0–44)
AST: 238 U/L — ABNORMAL HIGH (ref 15–41)
Albumin: 3.2 g/dL — ABNORMAL LOW (ref 3.5–5.0)
Alkaline Phosphatase: 303 U/L — ABNORMAL HIGH (ref 38–126)
Anion gap: 11 (ref 5–15)
BUN: 19 mg/dL (ref 8–23)
CO2: 22 mmol/L (ref 22–32)
Calcium: 9.5 mg/dL (ref 8.9–10.3)
Chloride: 104 mmol/L (ref 98–111)
Creatinine, Ser: 1.63 mg/dL — ABNORMAL HIGH (ref 0.61–1.24)
GFR, Estimated: 47 mL/min — ABNORMAL LOW (ref >60)
Glucose, Bld: 119 mg/dL — ABNORMAL HIGH (ref 70–99)
Potassium: 4.4 mmol/L (ref 3.5–5.1)
Sodium: 137 mmol/L (ref 135–145)
Total Bilirubin: 2.3 mg/dL — ABNORMAL HIGH (ref 0.3–1.2)
Total Protein: 8.4 g/dL — ABNORMAL HIGH (ref 6.5–8.1)

## 2020-03-25 LAB — CBC
HCT: 39.3 % (ref 39.0–52.0)
Hemoglobin: 13.5 g/dL (ref 13.0–17.0)
MCH: 30.4 pg (ref 26.0–34.0)
MCHC: 34.4 g/dL (ref 30.0–36.0)
MCV: 88.5 fL (ref 80.0–100.0)
Platelets: 279 10*3/uL (ref 150–400)
RBC: 4.44 MIL/uL (ref 4.22–5.81)
RDW: 13.7 % (ref 11.5–15.5)
WBC: 8.8 10*3/uL (ref 4.0–10.5)
nRBC: 0 % (ref 0.0–0.2)

## 2020-03-25 LAB — LIPASE, BLOOD: Lipase: 52 U/L — ABNORMAL HIGH (ref 11–51)

## 2020-03-25 MED ORDER — SODIUM CHLORIDE 0.9 % IV BOLUS
1000.0000 mL | Freq: Once | INTRAVENOUS | Status: AC
  Administered 2020-03-26: 1000 mL via INTRAVENOUS

## 2020-03-25 NOTE — ED Triage Notes (Signed)
Pt presents with c/o abdominal pain for approx one week. Pt went to his MD and they wanted him to come here for an abdominal CT scan.

## 2020-03-26 ENCOUNTER — Encounter (HOSPITAL_COMMUNITY): Payer: Self-pay

## 2020-03-26 ENCOUNTER — Inpatient Hospital Stay (HOSPITAL_COMMUNITY): Payer: Medicare HMO

## 2020-03-26 ENCOUNTER — Other Ambulatory Visit: Payer: Self-pay

## 2020-03-26 ENCOUNTER — Emergency Department (HOSPITAL_COMMUNITY): Payer: Medicare HMO

## 2020-03-26 DIAGNOSIS — R630 Anorexia: Secondary | ICD-10-CM

## 2020-03-26 DIAGNOSIS — Z6825 Body mass index (BMI) 25.0-25.9, adult: Secondary | ICD-10-CM | POA: Diagnosis not present

## 2020-03-26 DIAGNOSIS — F819 Developmental disorder of scholastic skills, unspecified: Secondary | ICD-10-CM | POA: Diagnosis present

## 2020-03-26 DIAGNOSIS — E119 Type 2 diabetes mellitus without complications: Secondary | ICD-10-CM | POA: Diagnosis present

## 2020-03-26 DIAGNOSIS — K7689 Other specified diseases of liver: Secondary | ICD-10-CM

## 2020-03-26 DIAGNOSIS — C801 Malignant (primary) neoplasm, unspecified: Secondary | ICD-10-CM

## 2020-03-26 DIAGNOSIS — E46 Unspecified protein-calorie malnutrition: Secondary | ICD-10-CM

## 2020-03-26 DIAGNOSIS — Z20822 Contact with and (suspected) exposure to covid-19: Secondary | ICD-10-CM | POA: Diagnosis present

## 2020-03-26 DIAGNOSIS — R109 Unspecified abdominal pain: Secondary | ICD-10-CM

## 2020-03-26 DIAGNOSIS — I81 Portal vein thrombosis: Secondary | ICD-10-CM | POA: Diagnosis present

## 2020-03-26 DIAGNOSIS — R569 Unspecified convulsions: Secondary | ICD-10-CM | POA: Diagnosis present

## 2020-03-26 DIAGNOSIS — G4730 Sleep apnea, unspecified: Secondary | ICD-10-CM | POA: Diagnosis present

## 2020-03-26 DIAGNOSIS — D684 Acquired coagulation factor deficiency: Secondary | ICD-10-CM | POA: Diagnosis present

## 2020-03-26 DIAGNOSIS — I871 Compression of vein: Secondary | ICD-10-CM | POA: Diagnosis present

## 2020-03-26 DIAGNOSIS — N179 Acute kidney failure, unspecified: Secondary | ICD-10-CM | POA: Diagnosis present

## 2020-03-26 DIAGNOSIS — E441 Mild protein-calorie malnutrition: Secondary | ICD-10-CM | POA: Diagnosis present

## 2020-03-26 DIAGNOSIS — C787 Secondary malignant neoplasm of liver and intrahepatic bile duct: Secondary | ICD-10-CM

## 2020-03-26 DIAGNOSIS — Z515 Encounter for palliative care: Secondary | ICD-10-CM | POA: Diagnosis not present

## 2020-03-26 DIAGNOSIS — I10 Essential (primary) hypertension: Secondary | ICD-10-CM

## 2020-03-26 DIAGNOSIS — D490 Neoplasm of unspecified behavior of digestive system: Secondary | ICD-10-CM | POA: Diagnosis present

## 2020-03-26 DIAGNOSIS — F79 Unspecified intellectual disabilities: Secondary | ICD-10-CM | POA: Diagnosis present

## 2020-03-26 DIAGNOSIS — Z66 Do not resuscitate: Secondary | ICD-10-CM | POA: Diagnosis not present

## 2020-03-26 DIAGNOSIS — Z79899 Other long term (current) drug therapy: Secondary | ICD-10-CM | POA: Diagnosis not present

## 2020-03-26 DIAGNOSIS — E7800 Pure hypercholesterolemia, unspecified: Secondary | ICD-10-CM | POA: Diagnosis present

## 2020-03-26 DIAGNOSIS — G4733 Obstructive sleep apnea (adult) (pediatric): Secondary | ICD-10-CM | POA: Diagnosis present

## 2020-03-26 DIAGNOSIS — R932 Abnormal findings on diagnostic imaging of liver and biliary tract: Secondary | ICD-10-CM | POA: Diagnosis not present

## 2020-03-26 DIAGNOSIS — R160 Hepatomegaly, not elsewhere classified: Secondary | ICD-10-CM | POA: Diagnosis present

## 2020-03-26 DIAGNOSIS — R066 Hiccough: Secondary | ICD-10-CM | POA: Diagnosis not present

## 2020-03-26 LAB — URINALYSIS, ROUTINE W REFLEX MICROSCOPIC
Bacteria, UA: NONE SEEN
Bilirubin Urine: NEGATIVE
Glucose, UA: NEGATIVE mg/dL
Hgb urine dipstick: NEGATIVE
Ketones, ur: NEGATIVE mg/dL
Leukocytes,Ua: NEGATIVE
Nitrite: NEGATIVE
Protein, ur: 100 mg/dL — AB
Specific Gravity, Urine: 1.024 (ref 1.005–1.030)
pH: 6 (ref 5.0–8.0)

## 2020-03-26 LAB — COMPREHENSIVE METABOLIC PANEL
ALT: 71 U/L — ABNORMAL HIGH (ref 0–44)
AST: 179 U/L — ABNORMAL HIGH (ref 15–41)
Albumin: 2.6 g/dL — ABNORMAL LOW (ref 3.5–5.0)
Alkaline Phosphatase: 249 U/L — ABNORMAL HIGH (ref 38–126)
Anion gap: 10 (ref 5–15)
BUN: 22 mg/dL (ref 8–23)
CO2: 23 mmol/L (ref 22–32)
Calcium: 8.9 mg/dL (ref 8.9–10.3)
Chloride: 106 mmol/L (ref 98–111)
Creatinine, Ser: 1.57 mg/dL — ABNORMAL HIGH (ref 0.61–1.24)
GFR, Estimated: 49 mL/min — ABNORMAL LOW (ref >60)
Glucose, Bld: 98 mg/dL (ref 70–99)
Potassium: 4.3 mmol/L (ref 3.5–5.1)
Sodium: 139 mmol/L (ref 135–145)
Total Bilirubin: 1.9 mg/dL — ABNORMAL HIGH (ref 0.3–1.2)
Total Protein: 7.5 g/dL (ref 6.5–8.1)

## 2020-03-26 LAB — PROTIME-INR
INR: 1.4 — ABNORMAL HIGH (ref 0.8–1.2)
Prothrombin Time: 16.8 seconds — ABNORMAL HIGH (ref 11.4–15.2)

## 2020-03-26 LAB — CBC
HCT: 38.2 % — ABNORMAL LOW (ref 39.0–52.0)
Hemoglobin: 13.1 g/dL (ref 13.0–17.0)
MCH: 30.6 pg (ref 26.0–34.0)
MCHC: 34.3 g/dL (ref 30.0–36.0)
MCV: 89.3 fL (ref 80.0–100.0)
Platelets: 237 10*3/uL (ref 150–400)
RBC: 4.28 MIL/uL (ref 4.22–5.81)
RDW: 13.6 % (ref 11.5–15.5)
WBC: 7.3 10*3/uL (ref 4.0–10.5)
nRBC: 0 % (ref 0.0–0.2)

## 2020-03-26 LAB — HEPARIN LEVEL (UNFRACTIONATED)
Heparin Unfractionated: 0.8 IU/mL — ABNORMAL HIGH (ref 0.30–0.70)
Heparin Unfractionated: 0.34 IU/mL (ref 0.30–0.70)

## 2020-03-26 LAB — RESP PANEL BY RT-PCR (FLU A&B, COVID) ARPGX2
Influenza A by PCR: NEGATIVE
Influenza B by PCR: NEGATIVE
SARS Coronavirus 2 by RT PCR: NEGATIVE

## 2020-03-26 LAB — APTT: aPTT: 36 seconds (ref 24–36)

## 2020-03-26 LAB — VALPROIC ACID LEVEL: Valproic Acid Lvl: 58 ug/mL (ref 50.0–100.0)

## 2020-03-26 LAB — HIV ANTIBODY (ROUTINE TESTING W REFLEX): HIV Screen 4th Generation wRfx: NONREACTIVE

## 2020-03-26 MED ORDER — LACOSAMIDE 50 MG PO TABS
100.0000 mg | ORAL_TABLET | ORAL | Status: DC
  Administered 2020-03-26 – 2020-04-03 (×17): 100 mg via ORAL
  Filled 2020-03-26 (×17): qty 2

## 2020-03-26 MED ORDER — LACTATED RINGERS IV SOLN: INTRAVENOUS | Status: DC

## 2020-03-26 MED ORDER — IOHEXOL 300 MG/ML  SOLN
100.0000 mL | Freq: Once | INTRAMUSCULAR | Status: AC | PRN
  Administered 2020-03-26: 80 mL via INTRAVENOUS

## 2020-03-26 MED ORDER — HEPARIN (PORCINE) 25000 UT/250ML-% IV SOLN
1150.0000 [IU]/h | INTRAVENOUS | Status: DC
  Administered 2020-03-26 – 2020-03-27 (×2): 1150 [IU]/h via INTRAVENOUS
  Filled 2020-03-26 (×2): qty 250

## 2020-03-26 MED ORDER — HEPARIN (PORCINE) 25000 UT/250ML-% IV SOLN
1400.0000 [IU]/h | INTRAVENOUS | Status: DC
  Administered 2020-03-26: 1400 [IU]/h via INTRAVENOUS
  Filled 2020-03-26: qty 250

## 2020-03-26 MED ORDER — HEPARIN BOLUS VIA INFUSION
4000.0000 [IU] | Freq: Once | INTRAVENOUS | Status: AC
  Administered 2020-03-26: 4000 [IU] via INTRAVENOUS
  Filled 2020-03-26: qty 4000

## 2020-03-26 MED ORDER — BRIMONIDINE TARTRATE 0.2 % OP SOLN: 1.0000 [drp] | Freq: Every day | OPHTHALMIC | Status: DC

## 2020-03-26 MED ORDER — SENNOSIDES-DOCUSATE SODIUM 8.6-50 MG PO TABS: 1.0000 | ORAL_TABLET | Freq: Every evening | ORAL | Status: DC | PRN

## 2020-03-26 MED ORDER — LEVETIRACETAM 500 MG PO TABS
500.0000 mg | ORAL_TABLET | Freq: Two times a day (BID) | ORAL | Status: DC
  Administered 2020-03-26 – 2020-04-02 (×16): 500 mg via ORAL
  Filled 2020-03-26 (×16): qty 1

## 2020-03-26 MED ORDER — PROPRANOLOL HCL 10 MG PO TABS
10.0000 mg | ORAL_TABLET | Freq: Two times a day (BID) | ORAL | Status: DC
  Administered 2020-03-26 – 2020-04-04 (×19): 10 mg via ORAL
  Filled 2020-03-26 (×19): qty 1

## 2020-03-26 MED ORDER — ONDANSETRON HCL 4 MG PO TABS: 4.0000 mg | ORAL_TABLET | Freq: Four times a day (QID) | ORAL | Status: DC | PRN

## 2020-03-26 MED ORDER — OXYCODONE HCL 5 MG PO TABS
5.0000 mg | ORAL_TABLET | ORAL | Status: DC | PRN
  Administered 2020-03-27: 5 mg via ORAL
  Administered 2020-03-28: 10 mg via ORAL
  Filled 2020-03-26: qty 2
  Filled 2020-03-26: qty 1

## 2020-03-26 MED ORDER — ORAL CARE MOUTH RINSE
15.0000 mL | Freq: Two times a day (BID) | OROMUCOSAL | Status: DC
  Administered 2020-03-26 – 2020-04-04 (×17): 15 mL via OROMUCOSAL

## 2020-03-26 MED ORDER — LATANOPROST 0.005 % OP SOLN
1.0000 [drp] | Freq: Every day | OPHTHALMIC | Status: DC
  Administered 2020-03-26 – 2020-04-03 (×9): 1 [drp] via OPHTHALMIC
  Filled 2020-03-26 (×2): qty 2.5

## 2020-03-26 MED ORDER — ONDANSETRON HCL 4 MG/2ML IJ SOLN
4.0000 mg | Freq: Four times a day (QID) | INTRAMUSCULAR | Status: DC | PRN
  Administered 2020-03-27: 4 mg via INTRAVENOUS
  Filled 2020-03-26: qty 2

## 2020-03-26 NOTE — ED Provider Notes (Signed)
[facility] DEPT Provider Note   CSN: [id number] Arrival date & time: 03/25/20  1051     History Chief Complaint  Patient presents with  . Abdominal Pain    TAHMIR KLECKNER is a 65 y.o. male.  The history is provided by a parent and medical records.  Abdominal Pain   LEVEL V CAVEAT:  DEVELOPMENTAL DELAY, PATIENT NON-VERBAL  65 y.o. M with hx of of developmental delay, DM, sleep apnea, seizures, hypertensive retinopathy, presenting to the ED for abdominal pain.  History provided by mother at bedside.  Patient reportedly has had abdominal pain for the past week.  States he has been refusing to [place].  No vomiting/diarrhea.  States he has been having issues with BM but did have one yesterday.  Seen at PCP today, had labs done, and was referred here for CT scan.  No prior abdominal surgeries.  No medications PTA.   Past Medical History:  Diagnosis Date  . Cataract    OU  . Developmental delay   . Diabetes mellitus without complication ([place])    not on medication; diet-controlled  . Hypertensive retinopathy    OU  . Seizures ([place])   . Sleep apnea     Patient Active Problem List   Diagnosis Date Noted  . Exertional dyspnea 07/17/2015  . OSA (obstructive sleep apnea) 07/17/2015  . Fatigue 02/01/2013  . Hypercholesterolemia 02/01/2013  . Seizure ([place]) 02/09/2012    Past Surgical History:  Procedure Laterality Date  . [place] VITRECTOMY WITH 20 GAUGE MVR PORT FOR MACULAR HOLE Right 12/29/2018   Procedure: 25 GAUGE PARS PLANA VITRECTOMY;  Surgeon: [name], MD;  Location: [facility];  Service: Ophthalmology;  Laterality: Right;       History reviewed. No pertinent family history.  Social History   Tobacco Use  . Smoking status: Never Smoker  . Smokeless tobacco: Never Used  Vaping Use  . Vaping Use: Never used  Substance Use Topics  . Alcohol use: No    Alcohol/week: 0.0 standard drinks  . Drug use: Never    Home  Medications Prior to Admission medications   Medication Sig Start Date End Date Taking? Authorizing Provider  amoxicillin (AMOXIL) 875 MG tablet Take 1 tablet (875 mg total) by mouth 2 (two) times daily. 02/29/20   Raylene Everts, MD  atropine 1 % ophthalmic solution Place 1 drop into the right eye daily.     [provider]  benzonatate (TESSALON) 200 MG capsule Take 1 capsule (200 mg total) by mouth 2 (two) times daily as needed for cough. 02/29/20   Raylene Everts, MD  brimonidine (ALPHAGAN) 0.2 % ophthalmic solution Place 1 drop into the right eye daily.    [provider]  carbamazepine (CARBATROL) 300 MG 12 hr capsule Take 300-600 mg by mouth See admin instructions. Take 300 mg in the morning and 600 mg in the evening    [provider]  DEPAKOTE ER 500 MG 24 hr tablet  02/15/19   [provider]  divalproex (DEPAKOTE ER) 250 MG 24 hr tablet Take 250-750 mg by mouth See admin instructions. Take 500 mg in the morning, 750 mg Mid-day and in the evening    [provider]  ergocalciferol (VITAMIN D2) 50000 units capsule Take 50,000 Units by mouth once a week.    [provider]  Lacosamide (VIMPAT) 100 MG TABS Take 100 mg by mouth 2 (two) times daily.    [provider]  latanoprost (XALATAN) 0.005 % ophthalmic solution Place 1 drop into both eyes at bedtime. 05/17/19   [provider]  levETIRAcetam (KEPPRA) 500 MG tablet Take 500 mg by mouth 2 (two) times daily.    [provider]  loratadine (CLARITIN) 10 MG tablet Take 10 mg by mouth daily.    [provider]  lovastatin (MEVACOR) 40 MG tablet Take 80 mg by mouth every evening.     [provider]  Multiple Vitamins-Minerals (MULTIVITAMIN ADULT PO) Take by mouth.    [provider]  ondansetron (ZOFRAN ODT) 4 MG disintegrating tablet Take 1 tablet (4 mg total) by mouth every 8 (eight) hours as needed for nausea or vomiting.  03/13/20   Lamptey, Myrene Galas, MD  pantoprazole (PROTONIX) 20 MG tablet Take 1 tablet (20 mg total) by mouth daily. 03/13/20   Chase Picket, MD  propranolol (INDERAL) 10 MG tablet Take 10 mg by mouth 2 (two) times daily.     [provider]  sertraline (ZOLOFT) 100 MG tablet Take 100 mg by mouth at bedtime.     [provider]  sertraline (ZOLOFT) 50 MG tablet  02/15/19   [provider]    Allergies    Patient has no known allergies.  Review of Systems   Review of Systems  Constitutional: Positive for appetite change.  Gastrointestinal: Positive for abdominal pain.  All other systems reviewed and are negative.   Physical Exam Updated Vital Signs BP 125/78 (BP Location: Right Arm)   Pulse 94   Temp 99 F (37.2 C) (Oral)   Resp 16   SpO2 95%   Physical Exam Vitals and nursing note reviewed.  Constitutional:      Appearance: He is well-developed and well-nourished.  HENT:     Head: Normocephalic and atraumatic.     Mouth/Throat:     Mouth: Oropharynx is clear and moist.  Eyes:     Extraocular Movements: EOM normal.     Conjunctiva/sclera: Conjunctivae normal.     Pupils: Pupils are equal, round, and reactive to light.  Cardiovascular:     Rate and Rhythm: Normal rate and regular rhythm.     Heart sounds: Normal heart sounds.  Pulmonary:     Effort: Pulmonary effort is normal.     Breath sounds: Normal breath sounds.  Abdominal:     General: Bowel sounds are normal.     Palpations: Abdomen is soft.     Tenderness: There is no abdominal tenderness. There is no rebound.     Comments: Abdominal overall appears soft, no elicited tenderness or appreciable moaning/wincing with deep palpation, normal bowel sounds  Musculoskeletal:        General: Normal range of motion.     Cervical back: Normal range of motion.  Skin:    General: Skin is warm and dry.  Neurological:     Mental Status: He is alert and oriented to person, place, and time.   Psychiatric:        Mood and Affect: Mood and affect normal.     ED Results / Procedures / Treatments   Labs (all labs ordered are listed, but only abnormal results are displayed) Labs Reviewed  LIPASE, BLOOD - Abnormal; Notable for the following components:      Result Value   Lipase 52 (*)    All other components within normal limits  COMPREHENSIVE METABOLIC PANEL - Abnormal; Notable for the following components:   Glucose, Bld 119 (*)  Creatinine, Ser 1.63 (*)    Total Protein 8.4 (*)    Albumin 3.2 (*)    AST 238 (*)    ALT 84 (*)    Alkaline Phosphatase 303 (*)    Total Bilirubin 2.3 (*)    GFR, Estimated 47 (*)    All other components within normal limits  URINALYSIS, ROUTINE W REFLEX MICROSCOPIC - Abnormal; Notable for the following components:   Color, Urine AMBER (*)    Protein, ur 100 (*)    All other components within normal limits  RESP PANEL BY RT-PCR (FLU A&B, COVID) ARPGX2  CBC  VALPROIC ACID LEVEL  APTT  PROTIME-INR  HEPARIN LEVEL (UNFRACTIONATED)    EKG None  Radiology CT ABDOMEN PELVIS W CONTRAST  Result Date: 03/26/2020 CLINICAL DATA:  Acute nonlocalized abdominal pain for 1 week, poor oral intake, emesis, nonverbal patient EXAM: CT ABDOMEN AND PELVIS WITH CONTRAST TECHNIQUE: Multidetector CT imaging of the abdomen and pelvis was performed using the standard protocol following bolus administration of intravenous contrast. CONTRAST:  62m OMNIPAQUE IOHEXOL 300 MG/ML  SOLN COMPARISON:  None. FINDINGS: Lower chest: Dependent ground-glass in the lungs is favored to reflect atelectatic change. Lung bases otherwise clear. Normal heart size. No pericardial effusion. Coronary artery and aortic leaflet calcifications. Hepatobiliary: Irregular lobular contour of both the left lobe liver and caudate lobe and inferior liver tip with multiple ill-defined hypoattenuating foci within. Additional smaller hypoattenuating foci are present throughout the liver. Right  lobe component measures approximately 6.2 by 7.6 cm (6/26). Conglomerate size of the focus in the left lobe measures approximately 4.4 x 7.9 cm. Focus in the caudate measures 9.2 x 6.7 cm (6/7). Additionally, there appears to be enhancing soft tissue extending from the ill-defined focus in the caudate lobe with some hypoattenuating material concerning for malignant tumor thrombus extension into the portal vein. Some resulting expansion of the main portal vein. The left portal vein appears entirely occluded. Partial occlusion of the proximal right portal and high-grade narrowing/near occlusion of the right anterior portal branches. Lobular, irregular liver surface contour, nonspecific. Some mild intrahepatic biliary ductal dilatation. The gallbladder is partially decompressed some mild wall thickening is nonspecific with pericholecystic inflammation. Difficult to follow the course of the extrahepatic biliary tree given compressive mass effect in the porta hepatis. No visible calcified gallstones. Pancreas: No pancreatic ductal dilatation or surrounding inflammatory changes. Spleen: Normal in size. No concerning splenic lesions. Adrenals/Urinary Tract: Normal adrenals. Kidneys are normally located with symmetric enhancementand excretion. Few fluid attenuation cysts in both kidneys. No suspicious renal lesion, urolithiasis or hydronephrosis. Urinary bladder is unremarkable for degree of distension. Stomach/Bowel: Distal esophagus, stomach and duodenal sweep are unremarkable. No small bowel wall thickening or dilatation. No evidence of obstruction. Appendix is air-filled and nondilated. No colonic dilatation or wall thickening. Redundancy of the sigmoid. No evidence of bowel obstruction. Vascular/Lymphatic: Atherosclerotic plaque throughout the abdominal aorta and branch vessels. Conspicuous adenopathy is seen in the porta hepatis and retroperitoneum including a 10 mm aortocaval node (2/34), and a prominent 7 mm node in  the porta hepatis (2/26). Reproductive: The prostate and seminal vesicles are unremarkable. Other: Edematous changes noted in the upper abdomen. No abdominopelvic free air or fluid. No bowel containing hernias. Musculoskeletal: No acute osseous abnormality or suspicious osseous lesion. Multilevel degenerative changes are present in the imaged portions of the spine. Transitional lumbosacral vertebrae with bony fusion of the adjacent sacral ala and superior SI joints. Additional degenerative changes in the hips and pelvis. IMPRESSION: 1.  Ill-defined hypoattenuating lesions present in the left lobe, caudate and right lobe liver with there are focal internal cystic foci. Appearance is highly concerning for metastatic disease or neoplasm. Additionally, there appears to be tumor thrombus extension into the main portal vein. The left portal vein appears entirely occluded. Partial occlusion of the proximal right portal and high-grade narrowing/near occlusion of the right anterior portal branches. 2. Conspicuous adenopathy in the porta hepatis and retroperitoneum, concerning for metastatic disease. 3. Lobular, irregular liver surface contour, nonspecific could reflect cirrhosis or intrinsic liver disease. 4. Mild gallbladder wall thickening and pericholecystic inflammation, nonspecific given the liver findings. Could correlate with clinical symptoms and consider further evaluation with right upper quadrant ultrasound as clinically indicated. 5. Aortic Atherosclerosis (ICD10-I70.0). These results were called by telephone at the time of interpretation on 03/26/2020 at 2:19 am to provider [facility] , who verbally acknowledged these results. Electronically Signed   By: Lovena Le M.D.   On: 03/26/2020 02:19    Procedures Procedures (including critical care time)  CRITICAL CARE Performed by: Larene Pickett   Total critical care time: 45 minutes  Critical care time was exclusive of separately billable procedures  and treating other patients.  Critical care was necessary to treat or prevent imminent or life-threatening deterioration.  Critical care was time spent personally by me on the following activities: development of treatment plan with patient and/or surrogate as well as nursing, discussions with consultants, evaluation of patient's response to treatment, examination of patient, obtaining history from patient or surrogate, ordering and performing treatments and interventions, ordering and review of laboratory studies, ordering and review of radiographic studies, pulse oximetry and re-evaluation of patient's condition.   Medications Ordered in ED Medications  heparin bolus via infusion 4,000 Units (has no administration in time range)  heparin ADULT infusion 100 units/mL (25000 units/246m) (has no administration in time range)  sodium chloride 0.9 % bolus 1,000 mL (0 mLs Intravenous Stopped 03/26/20 0229)  iohexol (OMNIPAQUE) 300 MG/ML solution 100 mL (80 mLs Intravenous Contrast Given 03/26/20 0149)    ED Course  I have reviewed the triage vital signs and the nursing notes.  Pertinent labs & imaging results that were available during my care of the patient were reviewed by me and considered in my medical decision making (see chart for details).    MDM Rules/Calculators/A&P    [age]year old male with severe developmental delay and nonverbal status, here with reported abdominal pain for the past week.  Seen by PCP today with abnormal LFTs, alk phos, and bili.  Sent to ED for CT scan.  Caregiver at bedside reports decreased oral intake without vomiting.  He has had some constipation issues which are chronic.  Labs here today with normal white count but consistently abnormal hepatic labs.  He does have a fairly benign abdominal exam, no elicited tenderness, wincing, or moaning with deep palpation of the stomach.  There is not appear to be any significant distention or fluid retention.  Bowel sounds are  normal.  Will proceed with CT scan.  CT unfortunately with lesions present in left lobe, caudate, and right lobe of the liver concerning for primary neoplasm or metastatic disease.  Does appear to have tumor extension into the portal vein which is entirely occluded along with partial occlusion of the proximal right portal branches.  Spoke with Dr. 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  May not resectable/curable but will not know more until biopsy. Depends on family's wishes for treatment options and  how aggressive they would like to be.  Can start heparin given portal vein occlusion, she will see in the morning.  2:53 AM Spoke with patient's POA, Helene Kelp, we have discussed lab and CT findings today.  She is currently in [place], unable to be here at the moment.  She does have concerns about being discharged and barriers to follow-up given his baseline developmental delay.  She would prefer admission to see oncology in the AM and at least discuss options.  She will also speak with siblings to have them weigh in as well.    Spoke with hospitalist, Dr. Tonie Griffith-- will admit for ongoing care.  Aware of oncology consult and evaluation to follow in the AM.  Final Clinical Impression(s) / ED Diagnoses Final diagnoses:  Portal vein thrombosis  Liver mass    Rx / DC Orders ED Discharge Orders    None       Larene Pickett, PA-C 03/26/20 [place], [place], DO 03/26/20 [phone]

## 2020-03-26 NOTE — Progress Notes (Signed)
Patient only gives "yes" or "no" answers and otherwise does not converse. When asked if he would like to use the nocturnal CPAP tonight, he answered "No". When asked if he uses his home machine at night, he also answered with "No". Direct education provided without effect. When asked if he understands that the CPAP could be helpful in maintaining his health, he answered "Yes". However, he continues to decline use tonight. RT will follow up again tomorrow and attempt better compliance. VSS on room air at this time.

## 2020-03-26 NOTE — Progress Notes (Signed)
   Patient seen and examined at bedside, patient admitted after midnight, please see earlier detailed admission note by Eben Burow, MD. Briefly, patient presented abdominal pain and decreased oral intake. CT abdomen/pelvis revealed multiple lesions concerning for metastatic disease of unknown primary.  Subjective: Patient smiles. Does not directly answer questions  BP (!) 142/85   Pulse 73   Temp 99 F (37.2 C) (Oral)   Resp 16   SpO2 99%   General exam: Appears calm and comfortable Respiratory system: Clear to auscultation. Respiratory effort normal. Cardiovascular system: S1 & S2 heard, RRR. No murmurs, rubs, gallops or clicks. Gastrointestinal system: Abdomen is mildly distended, soft and nontender. No organomegaly or masses felt. Normal bowel sounds heard. Central nervous system: Alert Musculoskeletal: No edema. No calf tenderness Skin: No cyanosis. No rashes  Brief assessment/Plan:  Liver lesions Concerning for metastatic disease. Oncology consulted and will see today. Recommended CEA, CA-19, AFP, which have been ordered. Will need to consider biopsy but will await oncology discussion with family.  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 Discussed with oncology who agrees with heparin drip for now. May not need long term anticoagulation -Continue Heparin drip  AKI Baseline creatinine from over one year prior of 0.83. Creatinine of 1.64 on admission. Started on IV fluids with mild improvement to 1.57 today. Patient with underlying liver disease with coagulopathy and hypoalbuminemia and will need to watch fluid status carefully. -Continue IV fluids for now; likely discharge in AM  Family communication: Sister on telephone (8 minutes) DVT prophylaxis: Heparin IV Disposition: Discharge likely in several days pending oncology recommendations at this time.  Cordelia Poche, MD [facility] 03/26/2020, 3:59 PM

## 2020-03-26 NOTE — ED Notes (Signed)
Report called and given to nurse, will transport pt up.

## 2020-03-26 NOTE — Progress Notes (Signed)
Pharmacy - IV heparin  Assessment:    Please see note from Rema Fendt) Glennon Mac, PharmD earlier today for full details.  Briefly, 65 y.o. male on IV heparin for portal vein thrombosis   Most recent heparin level SUPRAtherapeutic at 0.80 units/mL on 1400 units/hr  No bleeding or infusion issues per RN  Plan:   Decrease heparin to 1150 units/hr  Recheck 8 hr heparin level  Reuel Boom, PharmD, BCPS [phone] 03/26/2020, 12:39 PM

## 2020-03-26 NOTE — Progress Notes (Signed)
ANTICOAGULATION CONSULT NOTE - Initial Consult  Pharmacy Consult for heparin Indication: portal vein thrombosis  No Known Allergies  Patient Measurements:   Heparin Dosing Weight: 88kg  Vital Signs: BP: 146/80 (01/11 0228) Pulse Rate: 84 (01/11 0228)  Labs: Recent Labs    03/25/20 1711  HGB 13.5  HCT 39.3  PLT 279  CREATININE 1.63*    CrCl cannot be calculated (Unknown ideal weight.).   Medical History: Past Medical History:  Diagnosis Date  . Cataract    OU  . Developmental delay   . Diabetes mellitus without complication ([place])    not on medication; diet-controlled  . Hypertensive retinopathy    OU  . Seizures ([place])   . Sleep apnea     Assessment: 65 y.o. M with hx of of developmental delay, DM, sleep apnea, seizures, hypertensive retinopathy, presenting to the ED for abdominal pain.  History provided by mother at bedside.  Patient reportedly has had abdominal pain for the past week.  States he has been refusing to [place].  Pharmacy consulted to dose heparin drip for portal vein thrombosis.  No prior AC noted  03/26/2020  CBC WNL Scr 1.63 Baseline labs ordered STAT  Goal of Therapy:  Heparin level 0.3-0.7 units/ml Monitor platelets by anticoagulation protocol   Plan:  Heparin bolus 4000 units x 1 Start heparin drip at 1400 units/hr Heparin level in 6 hours Daily CBC  Dolly Rias RPh 03/26/2020, 3:14 AM

## 2020-03-26 NOTE — ED Notes (Signed)
Provided pt with breakfast tray. Cut up food for him

## 2020-03-26 NOTE — H&P (Signed)
History and Physical    OM LIZOTTE IDP:[id number] DOB: Jul 19, 1955 DOA: 03/25/2020  PCP: [name], MD   Patient coming from: home  Chief Complaint:  Abdominal pain, decreased po intake  HPI: Roy Young is a 65 y.o. male with medical history significant for developmental delay, OSA, seizure disorder, HTN, diet controlled diabetes who presents for evaluation of abdominal pain and decreased po intake. Pt is nonverbal at baseline. History was obtained from mother by ER provider. Mother is no longer present. [name] reportedly has had abdominal pain for the past week.  He reportedly has been refusing to eat or drink and would groan and hold his stomach area.  No vomiting or diarrhea reported. He has been having issues with BM but did have a BM on 03/25/2020.  Seen at PCP on 03/25/20 and had labs done. He was then referred to hospital for CT scan.  No prior abdominal surgeries. No change in medications recently.   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  Concern for undiagnosed cancer, primary or metastatic, and the liver.  With the occlusion of the portal vein he was started on heparin infusion for therapeutic anticoagulation.  Case was discussed by the ER provider with oncology who recommended admission to the hospitalist service and oncology would consult and see patient in the morning.  Review of Systems:  Unable to obtain review of systems secondary to patient's developmental delay  Past Medical History:  Diagnosis Date  . Cataract    OU  . Developmental delay   . Diabetes mellitus without complication ([place])    not on medication; diet-controlled  . Hypertensive retinopathy    OU  . Seizures ([place])   . Sleep apnea     Past Surgical History:  Procedure Laterality Date  . [place] VITRECTOMY WITH 20 GAUGE MVR PORT FOR MACULAR HOLE Right  12/29/2018   Procedure: 25 GAUGE PARS PLANA VITRECTOMY;  Surgeon: [name], MD;  Location: [facility];  Service: Ophthalmology;  Laterality: Right;    Social History  reports that he has never smoked. He has never used smokeless tobacco. He reports that he does not drink alcohol and does not use drugs.  No Known Allergies  History reviewed. No pertinent family history.   Prior to Admission medications   Medication Sig Start Date End Date Taking? Authorizing Provider  amoxicillin (AMOXIL) 875 MG tablet Take 1 tablet (875 mg total) by mouth 2 (two) times daily. 02/29/20   Raylene Everts, MD  atropine 1 % ophthalmic solution Place 1 drop into the right eye daily.     [provider]  benzonatate (TESSALON) 200 MG capsule Take 1 capsule (200 mg total) by mouth 2 (two) times daily as needed for cough. 02/29/20   Raylene Everts, MD  brimonidine (ALPHAGAN) 0.2 % ophthalmic solution Place 1 drop into the right eye daily.    [provider]  carbamazepine (CARBATROL) 300 MG 12 hr capsule Take 300-600 mg by mouth See admin instructions. Take 300 mg in the morning and 600 mg in the evening    [provider]  DEPAKOTE ER 500 MG 24 hr tablet  02/15/19   [provider]  divalproex (DEPAKOTE ER) 250 MG 24 hr tablet Take 250-750 mg by mouth See admin instructions. Take 500 mg in the morning, 750 mg  Mid-day and in the evening    [provider]  ergocalciferol (VITAMIN D2) 50000 units capsule Take 50,000 Units by mouth once a week.    [provider]  Lacosamide (VIMPAT) 100 MG TABS Take 100 mg by mouth 2 (two) times daily.    [provider]  latanoprost (XALATAN) 0.005 % ophthalmic solution Place 1 drop into both eyes at bedtime. 05/17/19   [provider]  levETIRAcetam (KEPPRA) 500 MG tablet Take 500 mg by mouth 2 (two) times daily.    [provider]  loratadine (CLARITIN) 10 MG tablet Take 10 mg by mouth daily.     [provider]  lovastatin (MEVACOR) 40 MG tablet Take 80 mg by mouth every evening.     [provider]  Multiple Vitamins-Minerals (MULTIVITAMIN ADULT PO) Take by mouth.    [provider]  ondansetron (ZOFRAN ODT) 4 MG disintegrating tablet Take 1 tablet (4 mg total) by mouth every 8 (eight) hours as needed for nausea or vomiting. 03/13/20   Lamptey, Myrene Galas, MD  pantoprazole (PROTONIX) 20 MG tablet Take 1 tablet (20 mg total) by mouth daily. 03/13/20   Chase Picket, MD  propranolol (INDERAL) 10 MG tablet Take 10 mg by mouth 2 (two) times daily.     [provider]  sertraline (ZOLOFT) 100 MG tablet Take 100 mg by mouth at bedtime.     [provider]  sertraline (ZOLOFT) 50 MG tablet  02/15/19   [provider]    Physical Exam: Vitals:   03/25/20 1706 03/26/20 0044 03/26/20 0100 03/26/20 0228  BP: 125/78 138/89 (!) 147/79 (!) 146/80  Pulse: 94 84 79 84  Resp: 16 16 16 16   Temp:      TempSrc:      SpO2: 95% 100% 100% 100%    Constitutional: NAD, calm, comfortable Vitals:   03/25/20 1706 03/26/20 0044 03/26/20 0100 03/26/20 0228  BP: 125/78 138/89 (!) 147/79 (!) 146/80  Pulse: 94 84 79 84  Resp: 16 16 16 16   Temp:      TempSrc:      SpO2: 95% 100% 100% 100%   General: WDWN  Eyes: PERRL, conjunctivae normal. No drainage. Sclera nonicteric HENT:  [place]/AT, external ears normal.  Nares patent without epistasis.  Mucous membranes are dry.  Neck: Soft, normal range of motion, supple, no masses, Trachea midline Respiratory: clear to auscultation bilaterally, no wheezing, no crackles. Normal respiratory effort. No accessory muscle use.  Cardiovascular: Regular rate and rhythm, no murmurs / rubs / gallops. No extremity edema.  Abdomen: Soft, diffuse tenderness evidenced by wincing to palpation, nondistended, no rebound or guarding.  No masses palpated. Bowel sounds normoactive Musculoskeletal: Moves all extremity spontaneously.  no cyanosis. Normal muscle tone.  Skin: Warm, dry, intact no rashes, lesions, ulcers. No induration Neurologic:  patella DTR +1 bilaterally.  Grip strength  5/5 bilaterally.  No tremor.   Labs on Admission: I have personally reviewed following labs and imaging studies  CBC: Recent Labs  Lab 03/25/20 1711  WBC 8.8  HGB 13.5  HCT 39.3  MCV 88.5  PLT 123XX123    Basic Metabolic Panel: Recent Labs  Lab 03/25/20 1711  NA 137  K 4.4  CL 104  CO2 22  GLUCOSE 119*  BUN 19  CREATININE 1.63*  CALCIUM 9.5    GFR: CrCl cannot be calculated (Unknown ideal weight.).  Liver Function Tests: Recent Labs  Lab 03/25/20 1711  AST 238*  ALT 84*  ALKPHOS 303*  BILITOT 2.3*  PROT 8.4*  ALBUMIN 3.2*    Urine analysis:    Component Value Date/Time   COLORURINE AMBER (A) 03/26/2020 0044   APPEARANCEUR CLEAR 03/26/2020 0044   LABSPEC 1.024 03/26/2020 0044   PHURINE 6.0 03/26/2020 0044   GLUCOSEU NEGATIVE 03/26/2020 0044   HGBUR NEGATIVE 03/26/2020 0044   BILIRUBINUR NEGATIVE 03/26/2020 0044   KETONESUR NEGATIVE 03/26/2020 0044   PROTEINUR 100 (A) 03/26/2020 0044   NITRITE NEGATIVE 03/26/2020 0044   LEUKOCYTESUR NEGATIVE 03/26/2020 0044    Radiological Exams on Admission: CT ABDOMEN PELVIS W CONTRAST  Result Date: 03/26/2020 CLINICAL DATA:  Acute nonlocalized abdominal pain for 1 week, poor oral intake, emesis, nonverbal patient EXAM: CT ABDOMEN AND PELVIS WITH CONTRAST TECHNIQUE: Multidetector CT imaging of the abdomen and pelvis was performed using the standard protocol following bolus administration of intravenous contrast. CONTRAST:  57mL OMNIPAQUE IOHEXOL 300 MG/ML  SOLN COMPARISON:  None. FINDINGS: Lower chest: Dependent ground-glass in the lungs is favored to reflect atelectatic change. Lung bases otherwise clear. Normal heart size. No pericardial effusion. Coronary artery and aortic leaflet calcifications. Hepatobiliary: Irregular lobular contour of both the left lobe liver  and caudate lobe and inferior liver tip with multiple ill-defined hypoattenuating foci within. Additional smaller hypoattenuating foci are present throughout the liver. Right lobe component measures approximately 6.2 by 7.6 cm (6/26). Conglomerate size of the focus in the left lobe measures approximately 4.4 x 7.9 cm. Focus in the caudate measures 9.2 x 6.7 cm (6/7). Additionally, there appears to be enhancing soft tissue extending from the ill-defined focus in the caudate lobe with some hypoattenuating material concerning for malignant tumor thrombus extension into the portal vein. Some resulting expansion of the main portal vein. The left portal vein appears entirely occluded. Partial occlusion of the proximal right portal and high-grade narrowing/near occlusion of the right anterior portal branches. Lobular, irregular liver surface contour, nonspecific. Some mild intrahepatic biliary ductal dilatation. The gallbladder is partially decompressed some mild wall thickening is nonspecific with pericholecystic inflammation. Difficult to follow the course of the extrahepatic biliary tree given compressive mass effect in the porta hepatis. No visible calcified gallstones. Pancreas: No pancreatic ductal dilatation or surrounding inflammatory changes. Spleen: Normal in size. No concerning splenic lesions. Adrenals/Urinary Tract: Normal adrenals. Kidneys are normally located with symmetric enhancementand excretion. Few fluid attenuation cysts in both kidneys. No suspicious renal lesion, urolithiasis or hydronephrosis. Urinary bladder is unremarkable for degree of distension. Stomach/Bowel: Distal esophagus, stomach and duodenal sweep are unremarkable. No small bowel wall thickening or dilatation. No evidence of obstruction. Appendix is air-filled and nondilated. No colonic dilatation or wall thickening. Redundancy of the sigmoid. No evidence of bowel obstruction. Vascular/Lymphatic: Atherosclerotic plaque throughout the  abdominal aorta and branch vessels. Conspicuous adenopathy is seen in the porta hepatis and retroperitoneum including a 10 mm aortocaval node (2/34), and a prominent 7 mm node in the porta hepatis (2/26). Reproductive: The prostate and seminal vesicles are unremarkable. Other: Edematous changes noted in the upper abdomen. No abdominopelvic free air or fluid. No bowel containing hernias. Musculoskeletal: No acute osseous abnormality or suspicious osseous lesion. Multilevel degenerative changes are present in the imaged portions of the spine. Transitional lumbosacral vertebrae with bony fusion of the adjacent sacral ala and superior SI joints. Additional degenerative changes in the hips and pelvis. IMPRESSION: 1. Ill-defined hypoattenuating lesions present in the left lobe, caudate and right lobe liver with there are focal internal cystic foci. Appearance is highly concerning for metastatic disease  or neoplasm. Additionally, there appears to be tumor thrombus extension into the main portal vein. The left portal vein appears entirely occluded. Partial occlusion of the proximal right portal and high-grade narrowing/near occlusion of the right anterior portal branches. 2. Conspicuous adenopathy in the porta hepatis and retroperitoneum, concerning for metastatic disease. 3. Lobular, irregular liver surface contour, nonspecific could reflect cirrhosis or intrinsic liver disease. 4. Mild gallbladder wall thickening and pericholecystic inflammation, nonspecific given the liver findings. Could correlate with clinical symptoms and consider further evaluation with right upper quadrant ultrasound as clinically indicated. 5. Aortic Atherosclerosis (ICD10-I70.0). These results were called by telephone at the time of interpretation on 03/26/2020 at 2:19 am to provider [facility] , who verbally acknowledged these results. Electronically Signed   By: Lovena Le M.D.   On: 03/26/2020 02:19    Assessment/Plan Principal  Problem:   Portal vein obstruction Mr. Ellsworth is admitted to med-surg floor.  He is started on heparin infusion for therapeutic anticoagulation.  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 Oncology has been consulted and will see patient in the morning to help determine further work-up to definitively diagnose neoplasms and formulate a plan    AKI (acute kidney injury)  Elevated BUN and creatinine level.  IV fluid hydration with LR at 100 ml/hr overnight.  Monitor electrolytes and renal function with labs in morning    Seizure  Chronic seizure disorder.  Stable.  No report of seizure activity.  Home medications will need to be verified and reconciled by pharmacy and resume.  We will continue Vimpat and Keppra which are on his list to make sure levels stay consistent until definitive medication list can be obtained    OSA (obstructive sleep apnea) Chronic CPAP as needed at night    Cognitive developmental delay Stable.  Patient is nonverbal at baseline    DVT prophylaxis: Placed on heparin infusion for therapeutic anticoagulation.  Code Status:   Full code  Family Communication:  No family is present.  Disposition Plan:   Patient is from:  home  Anticipated DC to:  home  Anticipated DC date:  Anticipate more than 2 midnight stay in hospital for workup of condition  Anticipated DC barriers: No barriers to discharge identified at this time  Consults called:  Oncology-Dr. Burr Medico, consulted by ER provider and will see pt in am. Admission status:  Inpatient   Yevonne Aline Cailey Trigueros MD [facility]  How to contact the [facility] Attending or Consulting provider [place] or covering provider during after hours [place], for this patient?   1. Check the care team in [facility] and look for a) attending/consulting [facility] provider listed and b) the  [facility] team listed 2. Log into [email or web address] and use [place]'s universal password to access. If you do not have the password, please contact the hospital operator. 3. Locate the [facility] provider you are looking for under [facility] and page to a number that you can be directly reached. 4. If you still have difficulty reaching the provider, please page the [facility] (Director on Call) for the Hospitalists listed on amion for assistance.  03/26/2020, 4:11 AM

## 2020-03-26 NOTE — Consult Note (Signed)
Roy Young  Telephone:([phone]) [place]  DOB: Dec 19, 1955  MR#: [id number]  CSN#: [id number]    Requesting Physician: [facility]  Patient Care Team: Roy Ebbs, MD as PCP - General (Internal Medicine)  Reason for consult: liver masses   History of present illness: 65 yo male with PMH of mental retardation, who lives in a group home, was sent to the ED by his primary care physician for worsening fatigue, anorexia, abdominal pain and abnormal labs.  History from chart and his sister, who is his healthcare power of attorney, but does not live with him.  ED work-up of CT scan showed multiple large liver masses, concerning for malignancy, and a possible tumor versus thrombosis in the portal vein.  He was started on heparin drip, and was admitted for further work-up.  Patient is awake, alert, but does not understand the questions and does not answer questions appropriately, he does not appear to be in acute distress when I saw him in ED.   MEDICAL HISTORY:  Past Medical History:  Diagnosis Date  . Cataract    OU  . Developmental delay   . Diabetes mellitus without complication ([place])    not on medication; diet-controlled  . Hypertensive retinopathy    OU  . Seizures ([place])   . Sleep apnea     SURGICAL HISTORY: Past Surgical History:  Procedure Laterality Date  . [place] VITRECTOMY WITH 20 GAUGE MVR PORT FOR MACULAR HOLE Right 12/29/2018   Procedure: 25 GAUGE PARS PLANA VITRECTOMY;  Surgeon: [name], MD;  Location: [facility];  Service: Ophthalmology;  Laterality: Right;    SOCIAL HISTORY: Social History   Socioeconomic History  . Marital status: Single    Spouse name: Not on file  . Number of children: Not on file  . Years of education: Not on file  . Highest education level: Not on file  Occupational History  . Not on file  Tobacco Use  . Smoking status: Never Smoker  .  Smokeless tobacco: Never Used  Vaping Use  . Vaping Use: Never used  Substance and Sexual Activity  . Alcohol use: No    Alcohol/week: 0.0 standard drinks  . Drug use: Never  . Sexual activity: Not Currently  Other Topics Concern  . Not on file  Social History Narrative   Lives in a group home w/five other residents; been with the same caregiver since 2001   Social Determinants of Health   Financial Resource Strain: Not on file  Food Insecurity: Not on file  Transportation Needs: Not on file  Physical Activity: Not on file  Stress: Not on file  Social Connections: Not on file  Intimate Partner Violence: Not on file    FAMILY HISTORY: History reviewed. No pertinent family history.  ALLERGIES:  has No Known Allergies.  MEDICATIONS:  Current Facility-Administered Medications  Medication Dose Route Frequency Provider Last Rate Last Admin  . heparin ADULT infusion 100 units/mL (25000 units/285mL)  1,150 Units/hr Intravenous Continuous Polly Cobia, RPH 11.5 mL/hr at 03/26/20 1324 1,150 Units/hr at 03/26/20 1324  . lacosamide (VIMPAT) tablet 100 mg  100 mg Oral 2 times per day Chotiner, Yevonne Aline, MD   100 mg at 03/26/20 1032  . lactated ringers infusion   Intravenous Continuous Chotiner, Yevonne Aline, MD 100 mL/hr at 03/26/20 0822 New Bag at 03/26/20 9924  . latanoprost (XALATAN) 0.005 % ophthalmic solution 1 drop  1 drop Both Eyes QHS Chotiner, Yevonne Aline, MD      . levETIRAcetam (KEPPRA) tablet 500 mg  500 mg Oral BID Chotiner, Yevonne Aline, MD   500 mg at 03/26/20 1032  . ondansetron (ZOFRAN) tablet 4 mg  4 mg Oral Q6H PRN Chotiner, Yevonne Aline, MD       Or  . ondansetron (ZOFRAN) injection 4 mg  4 mg Intravenous Q6H PRN Chotiner, Yevonne Aline, MD      . oxyCODONE (Oxy IR/ROXICODONE) immediate release tablet 5-10 mg  5-10 mg Oral Q4H PRN Mariel Aloe, MD      . propranolol (INDERAL) tablet 10 mg  10 mg Oral BID Chotiner, Yevonne Aline, MD   10 mg at 03/26/20 1034  . senna-docusate  (Senokot-S) tablet 1 tablet  1 tablet Oral QHS PRN Chotiner, Yevonne Aline, MD        REVIEW OF SYSTEMS:   Not able to obtain   PHYSICAL EXAMINATION: ECOG PERFORMANCE STATUS: 3 - Symptomatic, >50% confined to bed  Vitals:   03/26/20 1159 03/26/20 1447  BP: (!) 149/107 (!) 142/85  Pulse: 70 73  Resp: 16 16  Temp:    SpO2: 95% 99%   There were no vitals filed for this visit.  GENERAL:alert, no distress and comfortable SKIN: skin color, texture, turgor are normal, no rashes or significant lesions EYES: normal, conjunctiva are pink and non-injected, sclera clear NECK: supple, thyroid normal size, non-tender, without nodularity LYMPH:  no palpable lymphadenopathy in the cervical, axillary or inguinal LUNGS: clear to auscultation and percussion with normal breathing effort HEART: regular rate & rhythm and no murmurs and no lower extremity edema ABDOMEN:abdomen soft, non-tender and normal bowel sounds Musculoskeletal:no cyanosis of digits and no clubbing  PSYCH: alert & oriented x 3 with fluent speech NEURO: no focal motor/sensory deficits  LABORATORY DATA:  I have reviewed the data as listed Lab Results  Component Value Date   WBC 7.3 03/26/2020   HGB 13.1 03/26/2020   HCT 38.2 (L) 03/26/2020   MCV 89.3 03/26/2020   PLT 237 03/26/2020   Recent Labs    03/25/20 1711 03/26/20 0825  NA 137 139  K 4.4 4.3  CL 104 106  CO2 22 23  GLUCOSE 119* 98  BUN 19 22  CREATININE 1.63* 1.57*  CALCIUM 9.5 8.9  GFRNONAA 47* 49*  PROT 8.4* 7.5  ALBUMIN 3.2* 2.6*  AST 238* 179*  ALT 84* 71*  ALKPHOS 303* 249*  BILITOT 2.3* 1.9*    RADIOGRAPHIC STUDIES: I have personally reviewed the radiological images as listed and agreed with the findings in the report. CT ABDOMEN PELVIS W CONTRAST  Result Date: 03/26/2020 CLINICAL DATA:  Acute nonlocalized abdominal pain for 1 week, poor oral intake, emesis, nonverbal patient EXAM: CT ABDOMEN AND PELVIS WITH CONTRAST TECHNIQUE: Multidetector CT  imaging of the abdomen and pelvis was performed using the standard protocol following bolus administration of intravenous contrast. CONTRAST:  48mL OMNIPAQUE IOHEXOL 300 MG/ML  SOLN COMPARISON:  None. FINDINGS: Lower chest: Dependent ground-glass in the lungs is favored to reflect atelectatic change. Lung bases otherwise clear. Normal heart size. No pericardial effusion. Coronary artery and aortic leaflet calcifications. Hepatobiliary: Irregular lobular contour of both the left lobe liver and caudate lobe and inferior liver tip with multiple ill-defined hypoattenuating foci within. Additional smaller hypoattenuating foci are present throughout the liver. Right lobe component measures approximately 6.2 by 7.6 cm (6/26). Conglomerate size of the focus in the left lobe measures approximately 4.4  x 7.9 cm. Focus in the caudate measures 9.2 x 6.7 cm (6/7). Additionally, there appears to be enhancing soft tissue extending from the ill-defined focus in the caudate lobe with some hypoattenuating material concerning for malignant tumor thrombus extension into the portal vein. Some resulting expansion of the main portal vein. The left portal vein appears entirely occluded. Partial occlusion of the proximal right portal and high-grade narrowing/near occlusion of the right anterior portal branches. Lobular, irregular liver surface contour, nonspecific. Some mild intrahepatic biliary ductal dilatation. The gallbladder is partially decompressed some mild wall thickening is nonspecific with pericholecystic inflammation. Difficult to follow the course of the extrahepatic biliary tree given compressive mass effect in the porta hepatis. No visible calcified gallstones. Pancreas: No pancreatic ductal dilatation or surrounding inflammatory changes. Spleen: Normal in size. No concerning splenic lesions. Adrenals/Urinary Tract: Normal adrenals. Kidneys are normally located with symmetric enhancementand excretion. Few fluid attenuation  cysts in both kidneys. No suspicious renal lesion, urolithiasis or hydronephrosis. Urinary bladder is unremarkable for degree of distension. Stomach/Bowel: Distal esophagus, stomach and duodenal sweep are unremarkable. No small bowel wall thickening or dilatation. No evidence of obstruction. Appendix is air-filled and nondilated. No colonic dilatation or wall thickening. Redundancy of the sigmoid. No evidence of bowel obstruction. Vascular/Lymphatic: Atherosclerotic plaque throughout the abdominal aorta and branch vessels. Conspicuous adenopathy is seen in the porta hepatis and retroperitoneum including a 10 mm aortocaval node (2/34), and a prominent 7 mm node in the porta hepatis (2/26). Reproductive: The prostate and seminal vesicles are unremarkable. Other: Edematous changes noted in the upper abdomen. No abdominopelvic free air or fluid. No bowel containing hernias. Musculoskeletal: No acute osseous abnormality or suspicious osseous lesion. Multilevel degenerative changes are present in the imaged portions of the spine. Transitional lumbosacral vertebrae with bony fusion of the adjacent sacral ala and superior SI joints. Additional degenerative changes in the hips and pelvis. IMPRESSION: 1. Ill-defined hypoattenuating lesions present in the left lobe, caudate and right lobe liver with there are focal internal cystic foci. Appearance is highly concerning for metastatic disease or neoplasm. Additionally, there appears to be tumor thrombus extension into the main portal vein. The left portal vein appears entirely occluded. Partial occlusion of the proximal right portal and high-grade narrowing/near occlusion of the right anterior portal branches. 2. Conspicuous adenopathy in the porta hepatis and retroperitoneum, concerning for metastatic disease. 3. Lobular, irregular liver surface contour, nonspecific could reflect cirrhosis or intrinsic liver disease. 4. Mild gallbladder wall thickening and pericholecystic  inflammation, nonspecific given the liver findings. Could correlate with clinical symptoms and consider further evaluation with right upper quadrant ultrasound as clinically indicated. 5. Aortic Atherosclerosis (ICD10-I70.0). These results were called by telephone at the time of interpretation on 03/26/2020 at 2:19 am to provider [facility] , who verbally acknowledged these results. Electronically Signed   By: Lovena Le M.D.   On: 03/26/2020 02:19   US Abdomen Limited RUQ (LIVER/GB)  Result Date: 03/26/2020 CLINICAL DATA:  Abnormal CT. EXAM: ULTRASOUND ABDOMEN LIMITED RIGHT UPPER QUADRANT COMPARISON:  CT 03/26/2020. FINDINGS: Gallbladder: No gallstones or wall thickening visualized. No sonographic Murphy sign noted by sonographer. Common bile duct: Diameter: 2.2 mm Liver: Nodular very heterogeneous hepatic contour suggesting cirrhosis. 5.8 cm mass noted in the right hepatic lobe. Left hepatic and caudate hepatic masses best identified by prior CT. 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 IMPRESSION: 1. No gallstones or biliary distention. 2. Nodular hepatic contour suggesting cirrhosis. 5.8 cm mass noted in the right hepatic lobe.  Left hepatic and caudate hepatic masses best identified by prior CT. 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 Electronically Signed   By: Marcello Moores  Register   On: 03/26/2020 05:53    ASSESSMENT & PLAN:  65 yo male with PMH mental retardation, who lives in a group home, was sent to the ED by his primary care physician for worsening fatigue, anorexia, abdominal pain and abnormal labs.   1. Multiple liver masses, concerning for malignancy  2. 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 AKI  4. Abdominal pain, anorexia and malnutrition secondary to #1   Recommendations: -I have personally reviewed his CT scan from yesterday, which showed multiple enlarged liver masses in both lobes, highly concerning for malignancy, especially multifocal/metastatic  cholangiocarcinoma, metastatic cancer (although no primary tumor was identified on CT), or HCC.  -unfortunately regardless the cancer origin, his cancer is not resectable, and not curable.  Any treatment would be palliative to prolong his life or improve his quality of life. -Given his significant medical comorbidities, especially mental retardation, and limited social support, I do not think he is a candidate for chemotherapy, or other cancer treatment such as radiation dermatitis therapy. -I discussed the above with patient's sister Ms. Gilford Rile who is his healthcare power of attorney.  Given the high suspicion CT scan, and no treatment will be offered even with diagnosis, I did not recommend liver biopsy. I recommend palliative care and hospice, to focus on his symptom management and quality of life.  Ms. Gilford Rile is in complete agreement with the plan, and agrees with DNR/DNI.  -Patient is not able to go back to his group home, due to the demand of personal and medical care he needs.  Ms. Gilford Rile does not live locally, and is not able to take him to be with her.  She wishes patient to be placed in a nursing facility, and get hospice on board if possible. Will get our CM/SW to work on this.  -I also recommend no anticoagulation, due to the risk of bleeding and relatively low benefit of A/C. Ms Gilford Rile agrees.  -I communicated the above with Dr. Lonny Prude  -I will f/u as needed    All questions were answered. The patient knows to call the clinic with any problems, questions or concerns.      Truitt Merle, MD 03/26/2020 4:27 PM

## 2020-03-26 NOTE — Progress Notes (Signed)
ANTICOAGULATION CONSULT NOTE -   Pharmacy Consult for heparin Indication: portal vein thrombosis  No Known Allergies  Patient Measurements:   Heparin Dosing Weight: 88kg  Vital Signs: Temp: 98.1 F (36.7 C) (01/11 2119) Temp Source: Oral (01/11 2119) BP: 136/75 (01/11 2119) Pulse Rate: 81 (01/11 2119)  Labs: Recent Labs    03/25/20 1711 03/26/20 0424 03/26/20 0825 03/26/20 1045 03/26/20 2130  HGB 13.5  --  13.1  --   --   HCT 39.3  --  38.2*  --   --   PLT 279  --  237  --   --   APTT  --  36  --   --   --   LABPROT  --  16.8*  --   --   --   INR  --  1.4*  --   --   --   HEPARINUNFRC  --   --   --  0.80* 0.34  CREATININE 1.63*  --  1.57*  --   --     CrCl cannot be calculated (Unknown ideal weight.).   Medical History: Past Medical History:  Diagnosis Date  . Cataract    OU  . Developmental delay   . Diabetes mellitus without complication ([place])    not on medication; diet-controlled  . Hypertensive retinopathy    OU  . Seizures ([place])   . Sleep apnea     Assessment: 65 y.o. M with hx of of developmental delay, DM, sleep apnea, seizures, hypertensive retinopathy, presenting to the ED for abdominal pain.  History provided by mother at bedside.  Patient reportedly has had abdominal pain for the past week.  States he has been refusing to [place].  Pharmacy consulted to dose heparin drip for portal vein thrombosis.  No prior AC noted  03/26/2020  CBC WNL Scr 1.63 Baseline labs: INR 1.4, aPTT 16.8 2130 HL=0.34 therapeutic  Goal of Therapy:  Heparin level 0.3-0.7 units/ml Monitor platelets by anticoagulation protocol   Plan:  Continue heparin drip at 1150 units/hr Heparin level with am labs Daily CBC  Dolly Rias RPh 03/26/2020, 10:41 PM

## 2020-03-26 NOTE — ED Notes (Signed)
Sister/POA Helene Kelp would like an update [phone]

## 2020-03-26 NOTE — ED Notes (Signed)
Fed pt half of lunch tray. Requested finger food diet. Diet change to finger foods.

## 2020-03-27 DIAGNOSIS — I81 Portal vein thrombosis: Secondary | ICD-10-CM | POA: Diagnosis not present

## 2020-03-27 LAB — COMPREHENSIVE METABOLIC PANEL
ALT: 58 U/L — ABNORMAL HIGH (ref 0–44)
AST: 157 U/L — ABNORMAL HIGH (ref 15–41)
Albumin: 2.4 g/dL — ABNORMAL LOW (ref 3.5–5.0)
Alkaline Phosphatase: 254 U/L — ABNORMAL HIGH (ref 38–126)
Anion gap: 9 (ref 5–15)
BUN: 18 mg/dL (ref 8–23)
CO2: 22 mmol/L (ref 22–32)
Calcium: 8.8 mg/dL — ABNORMAL LOW (ref 8.9–10.3)
Chloride: 107 mmol/L (ref 98–111)
Creatinine, Ser: 1.18 mg/dL (ref 0.61–1.24)
GFR, Estimated: >60 mL/min (ref >60)
Glucose, Bld: 97 mg/dL (ref 70–99)
Potassium: 4.5 mmol/L (ref 3.5–5.1)
Sodium: 138 mmol/L (ref 135–145)
Total Bilirubin: 2.4 mg/dL — ABNORMAL HIGH (ref 0.3–1.2)
Total Protein: 6.9 g/dL (ref 6.5–8.1)

## 2020-03-27 LAB — CBC
HCT: 36.7 % — ABNORMAL LOW (ref 39.0–52.0)
Hemoglobin: 12.4 g/dL — ABNORMAL LOW (ref 13.0–17.0)
MCH: 30.2 pg (ref 26.0–34.0)
MCHC: 33.8 g/dL (ref 30.0–36.0)
MCV: 89.5 fL (ref 80.0–100.0)
Platelets: 212 10*3/uL (ref 150–400)
RBC: 4.1 MIL/uL — ABNORMAL LOW (ref 4.22–5.81)
RDW: 13.7 % (ref 11.5–15.5)
WBC: 7.4 10*3/uL (ref 4.0–10.5)
nRBC: 0 % (ref 0.0–0.2)

## 2020-03-27 LAB — HEPARIN LEVEL (UNFRACTIONATED): Heparin Unfractionated: 0.43 IU/mL (ref 0.30–0.70)

## 2020-03-27 LAB — PROTIME-INR
INR: 1.4 — ABNORMAL HIGH (ref 0.8–1.2)
Prothrombin Time: 16.9 seconds — ABNORMAL HIGH (ref 11.4–15.2)

## 2020-03-27 MED ORDER — DIVALPROEX SODIUM ER 500 MG PO TB24
750.0000 mg | ORAL_TABLET | Freq: Two times a day (BID) | ORAL | Status: DC
  Administered 2020-03-27 – 2020-04-02 (×13): 750 mg via ORAL
  Filled 2020-03-27 (×15): qty 1

## 2020-03-27 MED ORDER — CARBAMAZEPINE ER 200 MG PO TB12
600.0000 mg | ORAL_TABLET | Freq: Every day | ORAL | Status: DC
Start: 2020-03-27 — End: 2020-04-03
  Administered 2020-03-27 – 2020-04-01 (×6): 600 mg via ORAL
  Filled 2020-03-27 (×7): qty 3

## 2020-03-27 MED ORDER — PRAVASTATIN SODIUM 20 MG PO TABS: 80.0000 mg | ORAL_TABLET | Freq: Every day | ORAL | Status: DC

## 2020-03-27 MED ORDER — CARBAMAZEPINE ER 100 MG PO TB12
300.0000 mg | ORAL_TABLET | Freq: Every day | ORAL | Status: DC
  Administered 2020-03-27 – 2020-04-01 (×6): 300 mg via ORAL
  Filled 2020-03-27 (×8): qty 3

## 2020-03-27 MED ORDER — SERTRALINE HCL 50 MG PO TABS
50.0000 mg | ORAL_TABLET | Freq: Every day | ORAL | Status: DC
  Administered 2020-03-27 – 2020-04-04 (×9): 50 mg via ORAL
  Filled 2020-03-27 (×9): qty 1

## 2020-03-27 NOTE — Evaluation (Signed)
Physical Therapy Evaluation Patient Details Name: Roy Young MRN: [id number] DOB: 1956/02/21 Today's Date: 03/27/2020   History of Present Illness  Patient is 65 y.o. male with PMH significant for developmental delay, DM, seizures, hypertensive retinopathy. He presented to ED with abdominal pain and decreased oral intake. CT abdomen/pelvis revealed multiple lesions concerning for metastatic disease of unknown primary.    Clinical Impression  Roy Young is 65 y.o. male admitted with above HPI and diagnosis. Patient is currently limited by functional impairments below (see PT problem list). Patient admitted from group home and per pt's sister he is typically independent with mobility and ADL's at baseline but has been limited in the last week due to weakness. He currently requires min assist for transfers and gait to steady and prevent LOB ~75% of time. Patient will benefit from continued skilled PT interventions to address impairments and progress independence with mobility, recommending SNF follow up at this time. Acute PT will follow and progress as able.     Follow Up Recommendations SNF (vs HHPT if pt progresses in acute setting)    Equipment Recommendations  None recommended by PT    Recommendations for Other Services       Precautions / Restrictions Precautions Precautions: Fall Precaution Comments: pt fell a group home last week (Friday) Restrictions Weight Bearing Restrictions: No      Mobility  Bed Mobility               General bed mobility comments: pt OOB in recliner    Transfers Overall transfer level: Needs assistance Equipment used: None Transfers: Sit to/from Stand Sit to Stand: Min assist;Min guard         General transfer comment: visual cues to stand and pt able to initiate power up, min assist with rising to stand.  Ambulation/Gait Ambulation/Gait assistance: Min assist;Min guard Gait Distance (Feet): 200 Feet Assistive device:  Rolling walker (2 wheeled) Gait Pattern/deviations: Step-through pattern;Decreased step length - right;Decreased step length - left;Decreased stride length;Shuffle;Staggering right;Drifts right/left Gait velocity: decr   General Gait Details: min assist throughout to steady gait and prevent LOB on multiple occasions as pt has tendency to drift/stagger. pt able to correct ballance ~25% of the time without assist.  Stairs            Wheelchair Mobility    Modified Rankin (Stroke Patients Only)       Balance Overall balance assessment: Needs assistance Sitting-balance support: Feet supported Sitting balance-Leahy Scale: Good     Standing balance support: During functional activity;No upper extremity supported Standing balance-Leahy Scale: Fair Standing balance comment: multiple LOB with gait, pt requires assist to correct ~75% of time.                             Pertinent Vitals/Pain Pain Assessment: Faces Pain Score: 0-No pain Faces Pain Scale: No hurt Pain Intervention(s): Monitored during session    Home Living Family/patient expects to be discharged to:: Group home Living Arrangements: Group Home Available Help at Discharge: Personal care attendant (per chart review pt resides in home with 5 other residents and 3 caregivers.) Type of Home: Group Home         Home Equipment: None      Prior Function Level of Independence: Independent         Comments: pt was performing ADL's and mobilizing with no assist until ~ 1 week ago per pt's sister via phone call. she  reports he has had 1 known fall last week due to incresing weakness.     Hand Dominance        Extremity/Trunk Assessment   Upper Extremity Assessment Upper Extremity Assessment: Overall WFL for tasks assessed    Lower Extremity Assessment Lower Extremity Assessment: Overall WFL for tasks assessed (4/5 or better for MMT with hip flexion, knee extension, and ankle dorsiflexion.)     Cervical / Trunk Assessment Cervical / Trunk Assessment: Normal  Communication   Communication: No difficulties  Cognition Arousal/Alertness: Awake/alert Behavior During Therapy: WFL for tasks assessed/performed Overall Cognitive Status: Within Functional Limits for tasks assessed                                        General Comments      Exercises     Assessment/Plan    PT Assessment Patient needs continued PT services  PT Problem List Decreased strength;Decreased range of motion;Decreased activity tolerance;Decreased balance;Decreased mobility;Decreased knowledge of use of DME;Decreased knowledge of precautions;Decreased cognition       PT Treatment Interventions DME instruction;Gait training;Stair training;Functional mobility training;Therapeutic activities;Therapeutic exercise;Balance training;Patient/family education    PT Goals (Current goals can be found in the Care Plan section)  Acute Rehab PT Goals Patient Stated Goal: none stated PT Goal Formulation: Patient unable to participate in goal setting Time For Goal Achievement: 04/10/20 Potential to Achieve Goals: Good    Frequency Min 3X/week   Barriers to discharge Decreased caregiver support pt from group home and pt's sister reports there is one caregiver to help 5-6 residents (this contradicts information previously indicated in chart that there were 3 caregivers at the home)    Co-evaluation               AM-PAC PT "6 Clicks" Mobility  Outcome Measure Help needed turning from your back to your side while in a flat bed without using bedrails?: A Little Help needed moving from lying on your back to sitting on the side of a flat bed without using bedrails?: A Little Help needed moving to and from a bed to a chair (including a wheelchair)?: A Little Help needed standing up from a chair using your arms (e.g., wheelchair or bedside chair)?: A Little Help needed to walk in hospital room?:  A Little Help needed climbing 3-5 steps with a railing? : A Little 6 Click Score: 18    End of Session Equipment Utilized During Treatment: Gait belt Activity Tolerance: Patient tolerated treatment well Patient left: in chair;with call bell/phone within reach;with chair alarm set Nurse Communication: Mobility status PT Visit Diagnosis: Muscle weakness (generalized) (M62.81);Difficulty in walking, not elsewhere classified (R26.2)    Time: 9622-2979 PT Time Calculation (min) (ACUTE ONLY): 18 min   Charges:   PT Evaluation $PT Eval Moderate Complexity: 1 Mod          Verner Mould, DPT Acute Rehabilitation Services Office [phone] Pager [phone]    Jacques Navy 03/27/2020, 3:25 PM

## 2020-03-27 NOTE — Progress Notes (Signed)
PROGRESS NOTE    Roy Young  [id number] DOB: Jan 15, 1956 DOA: 03/25/2020 PCP: [name], MD   Brief Narrative: Roy Young is a 65 y.o. male with a hsitory of developmental delay, OSA, seizure disorder, hypertension, diabetes. 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 Medical oncology consulted with recommendations for comfort measures and hospice care on discharge.   Assessment & Plan:   Principal Problem:   Portal vein obstruction Active Problems:   Seizure (HCC)   OSA (obstructive sleep apnea)   Cognitive developmental delay   Neoplasm, liver   AKI (acute kidney injury) ([place])   Liver lesions Concerning for metastatic disease. Oncology consulted and will see today. Recommended CEA, CA-19, AFP, which have been ordered. CA 19-9 elevated. CEA normal. Medical oncology recommending hospice on discharge; patient not a candidate for treatment. Oncology discussed with HCPOA who decided on DNR and discharge with hospice care. 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 Discussed with oncology who agrees with heparin drip for now. Oncology recommending no anticoagulation. Heparin IV discontinued. Comfort measures.  AKI Baseline creatinine from over one year prior of 0.83. Creatinine of 1.64 on admission. Started on IV fluids with mild improvement to 1.57 today. Patient with underlying liver disease with coagulopathy and hypoalbuminemia and will need to watch fluid status carefully. Discontinue IV fluids.  OSA -CPAP as needed qhs  Cognitive developmental delay Limited verbal communication.  Seizure disorder -Continue Keppra, carbamazepine, Depakote, Vimpat   DVT prophylaxis: Hospice Code Status:   Code Status: DNR Family Communication:  None at bedside Disposition Plan: Discharge pending ability to discharge to safe living situation. Ideally discharge to long-term facility with hospice care   Consultants:   Medical oncology  Procedures:   None  Antimicrobials:  None    Subjective: Does not appear to have any concerns.  Objective: Vitals:   03/26/20 1637 03/26/20 2119 03/27/20 0111 03/27/20 0547  BP: 129/82 136/75  122/67  Pulse: 83 81  69  Resp:  14  14  Temp: 98.4 F (36.9 C) 98.1 F (36.7 C)  98.8 F (37.1 C)  TempSrc: Oral Oral  Oral  SpO2: 100% 97%  99%  Weight:   75.2 kg   Height:   5\' 8"  (1.727 m)     Intake/Output Summary (Last 24 hours) at 03/27/2020 1244 Last data filed at 03/27/2020 0311 Gross per 24 hour  Intake 2155.97 ml  Output -  Net 2155.97 ml   Filed Weights   03/27/20 0111  Weight: 75.2 kg    Examination:  General exam: Appears calm and comfortable. Pleasant. Respiratory system: Clear to auscultation. Respiratory effort normal. Cardiovascular system: S1 & S2 heard, RRR. Gastrointestinal system: Abdomen is soft and nontender. Normal bowel sounds heard. Central nervous system: Alert. No focal neurological deficits. Musculoskeletal: No edema. No calf tenderness Skin: No cyanosis. No rashes    Data Reviewed: I have personally reviewed following labs and imaging studies  CBC Lab Results  Component Value Date   WBC 7.4 03/27/2020   RBC 4.10 (L) 03/27/2020   HGB 12.4 (L) 03/27/2020   HCT 36.7 (L) 03/27/2020   MCV 89.5 03/27/2020   MCH 30.2 03/27/2020   PLT 212 03/27/2020   MCHC 33.8 03/27/2020   RDW 13.7 [id number]     Last metabolic panel Lab Results  Component Value  Date   NA 138 03/27/2020   K 4.5 03/27/2020   CL 107 03/27/2020   CO2 22 03/27/2020   BUN 18 03/27/2020   CREATININE 1.18 03/27/2020   GLUCOSE 97 03/27/2020   GFRNONAA >60 03/27/2020   GFRAA >60 12/29/2018   CALCIUM 8.8 (L) 03/27/2020   PROT 6.9 03/27/2020   ALBUMIN 2.4 (L) 03/27/2020    BILITOT 2.4 (H) 03/27/2020   ALKPHOS 254 (H) 03/27/2020   AST 157 (H) 03/27/2020   ALT 58 (H) 03/27/2020   ANIONGAP 9 03/27/2020    CBG (last 3)  No results for input(s): GLUCAP in the last 72 hours.   GFR: Estimated Creatinine Clearance: 61.2 mL/min (by C-G formula based on SCr of 1.18 mg/dL).  Coagulation Profile: Recent Labs  Lab 03/26/20 0424 03/27/20 0608  INR 1.4* 1.4*    Recent Results (from the past 240 hour(s))  Resp Panel by RT-PCR (Flu A&B, Covid) Nasopharyngeal Swab     Status: None   Collection Time: 03/26/20  4:24 AM   Specimen: Nasopharyngeal Swab; Nasopharyngeal(NP) swabs in vial transport medium  Result Value Ref Range Status   SARS Coronavirus 2 by RT PCR NEGATIVE NEGATIVE Final    Comment: (NOTE) SARS-CoV-2 target nucleic acids are NOT DETECTED.  The SARS-CoV-2 RNA is generally detectable in upper respiratory specimens during the acute phase of infection. The lowest concentration of SARS-CoV-2 viral copies this assay can detect is 138 copies/mL. A negative result does not preclude SARS-Cov-2 infection and should not be used as the sole basis for treatment or other patient management decisions. A negative result may occur with  improper specimen collection/handling, submission of specimen other than nasopharyngeal swab, presence of viral mutation(s) within the areas targeted by this assay, and inadequate number of viral copies(<138 copies/mL). A negative result must be combined with clinical observations, patient history, and epidemiological information. The expected result is Negative.  Fact Sheet for Patients:  EntrepreneurPulse.com.au  Fact Sheet for Healthcare Providers:  IncredibleEmployment.be  This test is no t yet approved or cleared by the [place] FDA and  has been authorized for detection and/or diagnosis of SARS-CoV-2 by FDA under an Emergency Use Authorization (EUA). This EUA will remain  in  effect (meaning this test can be used) for the duration of the COVID-19 declaration under Section 564(b)(1) of the Act, 21 U.S.C.section 360bbb-3(b)(1), unless the authorization is terminated  or revoked sooner.       Influenza A by PCR NEGATIVE NEGATIVE Final   Influenza B by PCR NEGATIVE NEGATIVE Final    Comment: (NOTE) The Xpert Xpress SARS-CoV-2/FLU/RSV plus assay is intended as an aid in the diagnosis of influenza from Nasopharyngeal swab specimens and should not be used as a sole basis for treatment. Nasal washings and aspirates are unacceptable for Xpert Xpress SARS-CoV-2/FLU/RSV testing.  Fact Sheet for Patients: EntrepreneurPulse.com.au  Fact Sheet for Healthcare Providers: IncredibleEmployment.be  This test is not yet approved or cleared by the [place] FDA and has been authorized for detection and/or diagnosis of SARS-CoV-2 by FDA under an Emergency Use Authorization (EUA). This EUA will remain in effect (meaning this test can be used) for the duration of the COVID-19 declaration under Section 564(b)(1) of the Act, 21 U.S.C. section 360bbb-3(b)(1), unless the authorization is terminated or revoked.  Performed at [facility], [place] [address], [place], [place] [address]         Radiology Studies: CT ABDOMEN PELVIS W CONTRAST  Result Date: 03/26/2020 CLINICAL DATA:  Acute  nonlocalized abdominal pain for 1 week, poor oral intake, emesis, nonverbal patient EXAM: CT ABDOMEN AND PELVIS WITH CONTRAST TECHNIQUE: Multidetector CT imaging of the abdomen and pelvis was performed using the standard protocol following bolus administration of intravenous contrast. CONTRAST:  51mL OMNIPAQUE IOHEXOL 300 MG/ML  SOLN COMPARISON:  None. FINDINGS: Lower chest: Dependent ground-glass in the lungs is favored to reflect atelectatic change. Lung bases otherwise clear. Normal heart size. No pericardial effusion. Coronary artery and  aortic leaflet calcifications. Hepatobiliary: Irregular lobular contour of both the left lobe liver and caudate lobe and inferior liver tip with multiple ill-defined hypoattenuating foci within. Additional smaller hypoattenuating foci are present throughout the liver. Right lobe component measures approximately 6.2 by 7.6 cm (6/26). Conglomerate size of the focus in the left lobe measures approximately 4.4 x 7.9 cm. Focus in the caudate measures 9.2 x 6.7 cm (6/7). Additionally, there appears to be enhancing soft tissue extending from the ill-defined focus in the caudate lobe with some hypoattenuating material concerning for malignant tumor thrombus extension into the portal vein. Some resulting expansion of the main portal vein. The left portal vein appears entirely occluded. Partial occlusion of the proximal right portal and high-grade narrowing/near occlusion of the right anterior portal branches. Lobular, irregular liver surface contour, nonspecific. Some mild intrahepatic biliary ductal dilatation. The gallbladder is partially decompressed some mild wall thickening is nonspecific with pericholecystic inflammation. Difficult to follow the course of the extrahepatic biliary tree given compressive mass effect in the porta hepatis. No visible calcified gallstones. Pancreas: No pancreatic ductal dilatation or surrounding inflammatory changes. Spleen: Normal in size. No concerning splenic lesions. Adrenals/Urinary Tract: Normal adrenals. Kidneys are normally located with symmetric enhancementand excretion. Few fluid attenuation cysts in both kidneys. No suspicious renal lesion, urolithiasis or hydronephrosis. Urinary bladder is unremarkable for degree of distension. Stomach/Bowel: Distal esophagus, stomach and duodenal sweep are unremarkable. No small bowel wall thickening or dilatation. No evidence of obstruction. Appendix is air-filled and nondilated. No colonic dilatation or wall thickening. Redundancy of the  sigmoid. No evidence of bowel obstruction. Vascular/Lymphatic: Atherosclerotic plaque throughout the abdominal aorta and branch vessels. Conspicuous adenopathy is seen in the porta hepatis and retroperitoneum including a 10 mm aortocaval node (2/34), and a prominent 7 mm node in the porta hepatis (2/26). Reproductive: The prostate and seminal vesicles are unremarkable. Other: Edematous changes noted in the upper abdomen. No abdominopelvic free air or fluid. No bowel containing hernias. Musculoskeletal: No acute osseous abnormality or suspicious osseous lesion. Multilevel degenerative changes are present in the imaged portions of the spine. Transitional lumbosacral vertebrae with bony fusion of the adjacent sacral ala and superior SI joints. Additional degenerative changes in the hips and pelvis. IMPRESSION: 1. Ill-defined hypoattenuating lesions present in the left lobe, caudate and right lobe liver with there are focal internal cystic foci. Appearance is highly concerning for metastatic disease or neoplasm. Additionally, there appears to be tumor thrombus extension into the main portal vein. The left portal vein appears entirely occluded. Partial occlusion of the proximal right portal and high-grade narrowing/near occlusion of the right anterior portal branches. 2. Conspicuous adenopathy in the porta hepatis and retroperitoneum, concerning for metastatic disease. 3. Lobular, irregular liver surface contour, nonspecific could reflect cirrhosis or intrinsic liver disease. 4. Mild gallbladder wall thickening and pericholecystic inflammation, nonspecific given the liver findings. Could correlate with clinical symptoms and consider further evaluation with right upper quadrant ultrasound as clinically indicated. 5. Aortic Atherosclerosis (ICD10-I70.0). These results were called by telephone at the time of  interpretation on 03/26/2020 at 2:19 am to provider [facility] , who verbally acknowledged these results.  Electronically Signed   By: Lovena Le M.D.   On: 03/26/2020 02:19   US Abdomen Limited RUQ (LIVER/GB)  Result Date: 03/26/2020 CLINICAL DATA:  Abnormal CT. EXAM: ULTRASOUND ABDOMEN LIMITED RIGHT UPPER QUADRANT COMPARISON:  CT 03/26/2020. FINDINGS: Gallbladder: No gallstones or wall thickening visualized. No sonographic Murphy sign noted by sonographer. Common bile duct: Diameter: 2.2 mm Liver: Nodular very heterogeneous hepatic contour suggesting cirrhosis. 5.8 cm mass noted in the right hepatic lobe. Left hepatic and caudate hepatic masses best identified by prior CT. 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 IMPRESSION: 1. No gallstones or biliary distention. 2. Nodular hepatic contour suggesting cirrhosis. 5.8 cm mass noted in the right hepatic lobe. Left hepatic and caudate hepatic masses best identified by prior CT. 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 Electronically Signed   By: [place]   On: 03/26/2020 05:53        Scheduled Meds: . carbamazepine  300 mg Oral Daily  . carbamazepine  600 mg Oral q1800  . divalproex  750 mg Oral BID  . lacosamide  100 mg Oral 2 times per day  . latanoprost  1 drop Both Eyes QHS  . levETIRAcetam  500 mg Oral BID  . mouth rinse  15 mL Mouth Rinse BID  . pravastatin  80 mg Oral q1800  . propranolol  10 mg Oral BID  . sertraline  50 mg Oral Daily   Continuous Infusions: . lactated ringers 100 mL/hr at 03/27/20 0432     LOS: 1 day     Cordelia Poche, MD [facility] 03/27/2020, 12:44 PM  If 7PM-7AM, please contact night-coverage [email or web address]

## 2020-03-27 NOTE — Progress Notes (Signed)
Patient resting well and appears comfortable upon entry to his room. Awakened him to assess nocturnal CPAP wants/needs. He answers "yeah" to sleeping well last night. Continues to deny CPAP tonight. He then states, " back to sleep" and answers "yeah" when asked if he would like to be left as he is to return to sleep. CPAP order changed to prn.

## 2020-03-28 DIAGNOSIS — I81 Portal vein thrombosis: Secondary | ICD-10-CM | POA: Diagnosis not present

## 2020-03-28 DIAGNOSIS — Z515 Encounter for palliative care: Secondary | ICD-10-CM

## 2020-03-28 DIAGNOSIS — C249 Malignant neoplasm of biliary tract, unspecified: Secondary | ICD-10-CM | POA: Diagnosis present

## 2020-03-28 DIAGNOSIS — N179 Acute kidney failure, unspecified: Secondary | ICD-10-CM

## 2020-03-28 DIAGNOSIS — R109 Unspecified abdominal pain: Secondary | ICD-10-CM | POA: Diagnosis present

## 2020-03-28 DIAGNOSIS — E8809 Other disorders of plasma-protein metabolism, not elsewhere classified: Secondary | ICD-10-CM | POA: Diagnosis present

## 2020-03-28 DIAGNOSIS — R634 Abnormal weight loss: Secondary | ICD-10-CM | POA: Diagnosis present

## 2020-03-28 DIAGNOSIS — E46 Unspecified protein-calorie malnutrition: Secondary | ICD-10-CM | POA: Diagnosis present

## 2020-03-28 DIAGNOSIS — D684 Acquired coagulation factor deficiency: Secondary | ICD-10-CM | POA: Diagnosis present

## 2020-03-28 MED ORDER — BISACODYL 10 MG RE SUPP: 10.0000 mg | Freq: Every day | RECTAL | Status: DC | PRN

## 2020-03-28 MED ORDER — LORAZEPAM 2 MG/ML IJ SOLN: 1.0000 mg | INTRAMUSCULAR | Status: DC | PRN

## 2020-03-28 MED ORDER — OXYCODONE HCL 5 MG PO TABS
5.0000 mg | ORAL_TABLET | Freq: Three times a day (TID) | ORAL | Status: DC
  Administered 2020-03-29 – 2020-04-04 (×19): 5 mg via ORAL
  Filled 2020-03-28 (×19): qty 1

## 2020-03-28 MED ORDER — BACLOFEN 10 MG PO TABS: 10.0000 mg | ORAL_TABLET | ORAL | Status: DC | PRN

## 2020-03-28 MED ORDER — SENNOSIDES-DOCUSATE SODIUM 8.6-50 MG PO TABS
1.0000 | ORAL_TABLET | Freq: Two times a day (BID) | ORAL | Status: DC
  Administered 2020-03-28 – 2020-04-04 (×14): 1 via ORAL
  Filled 2020-03-28 (×14): qty 1

## 2020-03-28 NOTE — TOC Initial Note (Signed)
Transition of Care [facility]) - Initial/Assessment Note    Patient Details  Name: Roy Young MRN: [id number] Date of Birth: Aug 02, 1955  [facility] CM/SW Contact:    Lynnell Catalan, RN Phone Number: 03/28/2020, 1:32 PM  Clinical Narrative:                 Spoke with pt sister at length for dc planning. Pt is from a Group home and per sister Helene Kelp she believes he shouldn't go back there because the group home owner has expressed to her that she can't afford to hire more help. Helene Kelp states that she feels the pt needs closer observation in case he tries to get up on his own. Per Physical therapy evaluation he is walking 200 feet min assist min guard. It was explained to Helene Kelp that pt could not go to SNF for rehab and use his hospice benefits as well. He would have to go to long term care under his medicaid to use his hospice benefits or go to SNF for rehab. Helene Kelp was informed that planning for LTC can be very limited at the hospital as this usually takes quite a bit of time. Helene Kelp was asked if he could come stay with her and she states that "we have a busy schedule". She was encouraged to come up with a back up plan if our planning for long term care for pt is unsuccessful. TOC will continue to follow.       Activities of Daily Living Home Assistive Devices/Equipment: CPAP ADL Screening (condition at time of admission) Patient's cognitive ability adequate to safely complete daily activities?: No Is the patient deaf or have difficulty hearing?: No Does the patient have difficulty seeing, even when wearing glasses/contacts?: Yes Does the patient have difficulty concentrating, remembering, or making decisions?: Yes Patient able to express need for assistance with ADLs?: No (patient nonverbal) Does the patient have difficulty dressing or bathing?: No Independently performs ADLs?: Yes (appropriate for developmental age) Does the patient have difficulty walking or climbing  stairs?: No Weakness of Legs: None Weakness of Arms/Hands: None   Admission diagnosis:  Portal vein thrombosis [I81] Portal vein obstruction [I81] Liver mass [R16.0] Abnormal CT of liver [R93.2] Patient Active Problem List   Diagnosis Date Noted  . 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 Palliative care by specialist 03/28/2020  . Abdominal pain 03/28/2020  . Unintentional weight loss 03/28/2020  . Hypoalbuminemia due to protein-calorie malnutrition ([place]) 03/28/2020  . 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 Cognitive developmental delay 03/26/2020  . Neoplasm, liver 03/26/2020  . Portal vein obstruction 03/26/2020  . AKI (acute kidney injury) ([place]) 03/26/2020  . Exertional dyspnea 07/17/2015  . OSA (obstructive sleep apnea) 07/17/2015  . Fatigue 02/01/2013  . Hypercholesterolemia 02/01/2013  . Seizure ([place]) 02/09/2012   PCP:  [name], MD Pharmacy:   [facility], [place] [address] Phone: [phone] Fax: [phone]     Social Determinants of Health (SDOH) Interventions    Readmission Risk Interventions No flowsheet data found.

## 2020-03-28 NOTE — Progress Notes (Signed)
Pt has been very lethargic today more than usual.  Yesterday pt walked the halls with PT. Attempted to get pt up to chair, but pt had no interest.  Pt's abdomen very distended and taut.  Pt needs assistance for feeding and  is burping a lot after bites of food.  .  Pt does state "Im full several times."  No bowel movement today.

## 2020-03-28 NOTE — Progress Notes (Addendum)
Palliative Care Progress Note  Patient seen and evaluated this evening. He was attempting to eat his meal when I was in his room and was having obvious difficulty and having what appeared to be some intermittent gagging and heaving. He had belching and hiccups as well. His abdomen is distended and he (to the degree he was able) did endorse pain. He is really not able to communicate his needs but he makes eye contact and did smile with gentle comfort gestures and calming.  I am going to order a bowel regimen and schedule pain medications. He is declining as I anticipated.  The only compassionate and humane discharge plan for him is a hospice facility for end of life care. Family were agreeable to him being reviewed by [facility] [place]. I will reach out to them to discuss his case tomorrow. He has been declined by [facility] for [place] placement. His family lives in [place] and his group home is unable to care for him in his current situation even with hospice support.  1. DNR, comfort measures only 2. Continue all of his anti-seizure medications-if he is unable to take PO he needs to be in a care environment that can administer [place], SL or PR meds for seizure and seizure prophylaxis. 3. Will schedule oxycodone three times a day and maintain prns. 4. Will order senna-s and a prn dulcolax supp for constipation. 5. Added prn baclofen for hiccups.  Lane Hacker, DO Palliative Medicine  Time: 35 minutes Greater than 50%  of this time was spent counseling and coordinating care related to the above assessment and plan.

## 2020-03-28 NOTE — Progress Notes (Signed)
PROGRESS NOTE    Roy Young  CZY:[id number] DOB: [birth year] DOA: 03/25/2020 PCP: [name], MD    Chief Complaint  Patient presents with  . Abdominal Pain    Brief Narrative:  Roy Young is a 65 y.o. male with a hsitory of developmental delay, OSA, seizure disorder, hypertension, diabetes. 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 Medical oncology consulted with recommendations for comfort measures and hospice care on discharge.   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  Oncology recommends hospice on discharge.  Pain control.    Portal vein obstruction:  Comfort measures.    AKI;  Improved.   OSA:  CPAP.    Seizures;  Continue with keppra , carbamazepine, depakote.    DVT prophylaxis: SCD'S Code Status: DNR Family Communication: none at bedside.  Disposition:   Status is: Inpatient  Remains inpatient appropriate because:Unsafe d/c plan and IV treatments appropriate due to intensity of illness or inability to take PO   Dispo: The patient is from: Home              Anticipated d/c is to: hospice.               Anticipated d/c date is: 1 day              Patient currently is medically stable to d/c.       Consultants:   Palliative care.    Procedures: none.    Antimicrobials:none.    Subjective: No new complaints.    Objective: Vitals:   03/27/20 1747 03/27/20 2109 03/28/20 0524 03/28/20 0605  BP: 126/71 126/69  111/67  Pulse: 72 73 64 65  Resp: 16 20  18   Temp: 98.3 F (36.8 C) 98.6 F (37 C)  97.6 F (36.4 C)  TempSrc: Oral Oral  Oral  SpO2: 99% 96% 98% 98%  Weight:      Height:        Intake/Output Summary (Last 24 hours) at 03/28/2020 1810 Last data filed at 03/28/2020 1700 Gross per 24 hour  Intake 480 ml  Output 1200 ml  Net -720 ml   Filed Weights   03/27/20 0111  Weight: 75.2 kg    Examination:  General exam: Appears calm and comfortable  Respiratory system: Clear to auscultation. Respiratory effort normal. Cardiovascular system: S1 & S2 heard, RRR. Gastrointestinal system: Abdomen is nondistended, soft and nontender.  Central nervous system: Alert and comfortable.  Extremities: nocyanosis.  Skin: No rashes, lesions or ulcers Psychiatry: Mood & affect appropriate.     Data Reviewed: I have personally reviewed following labs and imaging studies  CBC: Recent Labs  Lab 03/25/20 1711 03/26/20 0825 03/27/20 0608  WBC 8.8 7.3 7.4  HGB 13.5 13.1 12.4*  HCT 39.3 38.2* 36.7*  MCV 88.5 89.3 89.5  PLT 279 237 093    Basic Metabolic Panel: Recent Labs  Lab 03/25/20 1711  03/26/20 0825 03/27/20 0608  NA 137 139 138  K 4.4 4.3 4.5  CL 104 106 107  CO2 22 23 22   GLUCOSE 119* 98 97  BUN 19 22 18   CREATININE 1.63* 1.57* 1.18  CALCIUM 9.5 8.9 8.8*    GFR: Estimated Creatinine Clearance: 61.2 mL/min (by C-G formula based on SCr of 1.18 mg/dL).  Liver Function Tests: Recent Labs  Lab 03/25/20 1711 03/26/20 0825 03/27/20 0608  AST 238* 179* 157*  ALT 84* 71* 58*  ALKPHOS 303* 249* 254*  BILITOT 2.3* 1.9* 2.4*  PROT 8.4* 7.5 6.9  ALBUMIN 3.2* 2.6* 2.4*    CBG: No results for input(s): GLUCAP in the last 168 hours.   Recent Results (from the past 240 hour(s))  Resp Panel by RT-PCR (Flu A&B, Covid) Nasopharyngeal Swab     Status: None   Collection  Time: 03/26/20  4:24 AM   Specimen: Nasopharyngeal Swab; Nasopharyngeal(NP) swabs in vial transport medium  Result Value Ref Range Status   SARS Coronavirus 2 by RT PCR NEGATIVE NEGATIVE Final    Comment: (NOTE) SARS-CoV-2 target nucleic acids are NOT DETECTED.  The SARS-CoV-2 RNA is generally detectable in upper respiratory specimens during the acute phase of infection. The lowest concentration of SARS-CoV-2 viral copies this assay can detect is 138 copies/mL. A negative result does not preclude SARS-Cov-2 infection and should not be used as the sole basis for treatment or other patient management decisions. A negative result may occur with  improper specimen collection/handling, submission of specimen other than nasopharyngeal swab, presence of viral mutation(s) within the areas targeted by this assay, and inadequate number of viral copies(<138 copies/mL). A negative result must be combined with clinical observations, patient history, and epidemiological information. The expected result is Negative.  Fact Sheet for Patients:  EntrepreneurPulse.com.au  Fact Sheet for Healthcare Providers:  IncredibleEmployment.be  This test is no t yet approved or cleared by the [place] FDA and  has been authorized for detection and/or diagnosis of SARS-CoV-2 by FDA under an Emergency Use Authorization (EUA). This EUA will remain  in effect (meaning this test can be used) for the duration of the COVID-19 declaration under Section 564(b)(1) of the Act, 21 U.S.C.section 360bbb-3(b)(1), unless the authorization is terminated  or revoked sooner.       Influenza A by PCR NEGATIVE NEGATIVE Final   Influenza B by PCR NEGATIVE NEGATIVE Final    Comment: (NOTE) The Xpert Xpress SARS-CoV-2/FLU/RSV plus assay is intended as an aid in the diagnosis of influenza from Nasopharyngeal swab specimens and should not be used as a sole basis for treatment. Nasal washings  and aspirates are unacceptable for Xpert Xpress SARS-CoV-2/FLU/RSV testing.  Fact Sheet for Patients: EntrepreneurPulse.com.au  Fact Sheet for Healthcare Providers: IncredibleEmployment.be  This test is not yet approved or cleared by the [place] FDA and has been authorized for detection and/or diagnosis of SARS-CoV-2 by FDA under an Emergency Use Authorization (EUA). This EUA will remain in effect (meaning this test can be used) for the duration of the COVID-19 declaration under Section 564(b)(1) of the Act, 21 U.S.C. section 360bbb-3(b)(1), unless the authorization is terminated or revoked.  Performed at [facility], [place] [address], [place], [place] [address]          Radiology Studies: No results found.      Scheduled Meds: . carbamazepine  300 mg Oral Daily  . carbamazepine  600 mg Oral q1800  . divalproex  750 mg Oral BID  .  lacosamide  100 mg Oral 2 times per day  . latanoprost  1 drop Both Eyes QHS  . levETIRAcetam  500 mg Oral BID  . mouth rinse  15 mL Mouth Rinse BID  . propranolol  10 mg Oral BID  . sertraline  50 mg Oral Daily   Continuous Infusions:   LOS: 2 days        Hosie Poisson, MD [facility]   To contact the attending provider between 7A-7P or the covering provider during after hours 7P-7A, please log into the web site [email or web address] and access using universal [place] password for that web site. If you do not have the password, please call the hospital operator.  03/28/2020, 6:10 PM

## 2020-03-28 NOTE — Consult Note (Addendum)
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 Oncology has seen patient and recommended hospice care. He is not a candidate for systemic treatment or other advanced interventions based on large tumor burden and current functional status and debility. I was asked to consult yesterday by health system leadership due to family expressing concerns and confusion about discharge plan.  I spoke at length with patient's POA Roy Young. She expressed concerns that there was disconnect between what she was being told by oncology in terms of hospice and the conversation she had with TOC. She thought he was supposed to be  "going to "hospice" but conversations from [facility] were about nursing facilities. I provided education about hospice care and levels of care as well as locations where hospice care can be delivered. Her main concern is that he is currently in a group home that cannot provide the amount of supervision and assistance that he needs.  Much of his eligibility will depend on the hospice agency review and his current level of care needs. He is absolutely appropriate for hospice care-the challenge will be in his prognostication and in anticipating his symptom management needs. His particular cancer has a prognosis of <2 months on average- I anticipate much less time for him given his tumor involvement in the vasculature of his hepatobiliary system.  His liver enzymes are abnormal in an obstructive pattern, his albumin is very low, his INR is elevated and he is at risk for sudden death, bleeding and rapid decline. He is sleeping most of the time and is very weak -minimal to no PO intake- I would predict with all of these consideration that he has less than 2 week prognosis and would best be served by a hospice facility for EOL care and challenging symptom management dur to his cognitive issues  and inability to express pain accurately.  Recommendations:  1. DNR, Comfort Measures Only 2. Frequent monitoring for pain and other symptoms 3. Continue prn oxycodone-may need additional medication including scheduled dosing 4. PO intake as tolerated 5. Refer for hospice facility level of care.  Lane Hacker, DO Palliative Medicine [phone]  Time: 50 minutes Greater than 50%  of this time was spent counseling and coordinating care related to the above assessment and plan.

## 2020-03-28 NOTE — TOC Progression Note (Signed)
Transition of Care [facility]) - Progression Note    Patient Details  Name: Roy Young MRN: [id number] Date of Birth: 08/30/1955  [facility] CM/SW Contact  Sujay Grundman, Marjie Skiff, RN Phone Number: 03/28/2020, 1:40 PM  Clinical Narrative:     This CM was asked by PMT to check into residential hospice placement for pt. This CM contacted [facility] to do an review of pt medical info to see if he qualifies for residential hospice at [facility].  Per [facility] pt does not qualify for Residential hospice at [facility]. This CM has left message with liaison for [facility] to see if they could assess pt as well for residential hospice. If they will not accept pt as well TOC will have to go back to plan for SNF with outpatient palliative or LTC placement with Hospice. TOC will continue to follow.

## 2020-03-28 NOTE — Progress Notes (Signed)
Manufacturing engineer [facility]) Hospital Liaison note.    Received request from [place] for possible interest in [facility]. Chart and pt information have been reviewed by [facility] physician.  At this time patient is eligible for hospice services, with a life expectancy <6 months, but is not deemed to be eligible for residential hospice, with a life expectancy thought to be > 2 weeks.    [facility] will be available to reach out to family to discuss hospice support at a long term care facility or to reassess residential hospice eligibility as disease progresses. TOC Alinda Sierras made aware.  Please do not hesitate to call with questions. Thank you for the opportunity to participate in this patient's care.  Domenic Moras, BSN, RN [facility] Liaison (listed on [place] under Hospice/[facility])    [phone] (24h on call)

## 2020-03-29 DIAGNOSIS — I81 Portal vein thrombosis: Secondary | ICD-10-CM | POA: Diagnosis not present

## 2020-03-29 DIAGNOSIS — N179 Acute kidney failure, unspecified: Secondary | ICD-10-CM | POA: Diagnosis not present

## 2020-03-29 MED ORDER — LIP MEDEX EX OINT
TOPICAL_OINTMENT | CUTANEOUS | Status: AC
  Administered 2020-03-29: 1
  Filled 2020-03-29: qty 7

## 2020-03-29 NOTE — Progress Notes (Signed)
Physical Therapy Treatment Patient Details Name: Roy Young MRN: [id number] DOB: 1956/03/15 Today's Date: 03/29/2020    History of Present Illness Patient is 65 y.o. male with PMH significant for developmental delay, DM, seizures, hypertensive retinopathy. He presented to ED with abdominal pain and decreased oral intake. CT abdomen/pelvis revealed multiple lesions concerning for metastatic disease of unknown primary.    PT Comments    Patient notably weaker today and required increased assist/cues to initiate and sequence mobility. 2HHA provided for transfers and gait today and pt with reduced step length and foot clearance during mobility. He ambulated ~60' requiring min-mod assist to stabilize vs min guard last session. Patient is planning to discharge to residential hospice facility per family, he will benefit from ongoing skilled PT at this time to optimize quality of life as pt remains mobile. Acute PT will continue to address mobility deficits throughout stay.   Follow Up Recommendations  SNF;Other (comment) (pt planning for residential hospice)     Equipment Recommendations  None recommended by PT    Recommendations for Other Services       Precautions / Restrictions Precautions Precautions: Fall Precaution Comments: pt fell a group home last week (Friday) Restrictions Weight Bearing Restrictions: No    Mobility  Bed Mobility Overal bed mobility: Needs Assistance Bed Mobility: Supine to Sit     Supine to sit: HOB elevated;Mod assist;Max assist     General bed mobility comments: pt required moderate multimodal cues for sequencing/initiating supine to EOB transfer. he benefited most from tactile cues at back to begin sitting/raising trunk. patient required assist to bring LE's towards EOB and Mod-Max assist with use of bed pad to pivot and scoot forward to place feet on floor.  Transfers Overall transfer level: Needs assistance Equipment used: 2 person hand held  assist Transfers: Sit to/from Stand Sit to Stand: Min assist;Mod assist         General transfer comment: multimodal cues for initiating sit<>stand from EOB, visual/tactile cues most beneficial. patient required min assist to initiate and mod assist to steady with rising.  Ambulation/Gait Ambulation/Gait assistance: Min assist;Mod assist Gait Distance (Feet): 60 Feet Assistive device: 2 person hand held assist Gait Pattern/deviations: Step-through pattern;Decreased step length - right;Decreased step length - left;Decreased stride length;Shuffle;Staggering right;Drifts right/left Gait velocity: decr   General Gait Details: pt notably weaker today and min-mod assist required with 2 person HHA to steady and prevent LOB throughout. Pt with slight Rt lean during gait and decresed foot clearance with stepping. pt's cousin present and encouraging pt to ambulate forward towards him.   Stairs             Wheelchair Mobility    Modified Rankin (Stroke Patients Only)       Balance Overall balance assessment: Needs assistance Sitting-balance support: Feet supported Sitting balance-Leahy Scale: Good     Standing balance support: During functional activity;No upper extremity supported Standing balance-Leahy Scale: Poor Standing balance comment: multiple LOB with gait, pt reliant on support this session for gait.                            Cognition Arousal/Alertness: Awake/alert Behavior During Therapy: WFL for tasks assessed/performed Overall Cognitive Status: History of cognitive impairments - at baseline                                 General Comments: pt  with developmental delay, family present and reports he is at baseline currently. he is mostly non-verbal but did recognize his family.      Exercises      General Comments        Pertinent Vitals/Pain Pain Assessment: Faces Faces Pain Scale: No hurt Pain Intervention(s): Monitored  during session;Repositioned    Home Living                      Prior Function            PT Goals (current goals can now be found in the care plan section) Acute Rehab PT Goals Patient Stated Goal: none stated PT Goal Formulation: Patient unable to participate in goal setting Time For Goal Achievement: 04/10/20 Potential to Achieve Goals: Good Progress towards PT goals: Progressing toward goals    Frequency    Min 3X/week      PT Plan Current plan remains appropriate    Co-evaluation              AM-PAC PT "6 Clicks" Mobility   Outcome Measure  Help needed turning from your back to your side while in a flat bed without using bedrails?: A Lot Help needed moving from lying on your back to sitting on the side of a flat bed without using bedrails?: A Lot Help needed moving to and from a bed to a chair (including a wheelchair)?: A Lot Help needed standing up from a chair using your arms (e.g., wheelchair or bedside chair)?: A Lot Help needed to walk in hospital room?: A Lot Help needed climbing 3-5 steps with a railing? : Total 6 Click Score: 11    End of Session Equipment Utilized During Treatment: Gait belt Activity Tolerance: Patient tolerated treatment well Patient left: in chair;with call bell/phone within reach;with chair alarm set Nurse Communication: Mobility status PT Visit Diagnosis: Muscle weakness (generalized) (M62.81);Difficulty in walking, not elsewhere classified (R26.2)     Time: 1209-1227 PT Time Calculation (min) (ACUTE ONLY): 18 min  Charges:  $Gait Training: 8-22 mins                     Verner Mould, DPT Acute Rehabilitation Services Office [phone] Pager [phone]     Jacques Navy 03/29/2020, 12:40 PM

## 2020-03-29 NOTE — TOC Progression Note (Signed)
Transition of Care [facility] - Progression Note    Patient Details  Name: Roy Young MRN: [id number] Date of Birth: [birth year]  Transition of [facility]) CM/SW Contact  Purcell Mouton, RN Phone Number: 03/29/2020, 3:55 PM  Clinical Narrative:     A call was made to [place] return call, states she will review pt in Epic.        Expected Discharge Plan and Services                                                 Social Determinants of Health (SDOH) Interventions    Readmission Risk Interventions No flowsheet data found.

## 2020-03-29 NOTE — Care Management Important Message (Signed)
Important Message  Patient Details IM Letter given to the Patient. Name: Roy Young MRN: [id number] Date of Birth: [birth year]   Medicare Important Message Given:  Yes     Kerin Salen 03/29/2020, 11:10 AM

## 2020-03-29 NOTE — Progress Notes (Signed)
PROGRESS NOTE    Roy Young  [id number] DOB: 08/09/1955 DOA: 03/25/2020 PCP: [name], MD    Chief Complaint  Patient presents with  . Abdominal Pain    Brief Narrative:  Roy Young is a 65 y.o. male with a hsitory of developmental delay, OSA, seizure disorder, hypertension, diabetes. 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 Medical oncology consulted with recommendations for comfort measures and hospice care on discharge. Pt seen and examined today, he appears sleepy today. No new complaints.    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  Oncology recommends hospice on discharge.  Pain control with oxycodone TID and prn.  Stool softeners and dulcolax prn.    Portal vein obstruction:  Comfort measures. No intervention planned at this time.    Protein calorie malnutrition;    AKI;  Resolved with hydration. No new lab work at this time.   OSA:  CPAP  Cognitive developmental delay: Appears stable.    Seizures;  Continue with keppra , carbamazepine, depakote and lacosamide. Currently able to take all meds orally at this time.   Disposition:  Plan for residential hospice placement at this time.    DVT prophylaxis: SCD'S Code Status: DNR Family Communication: none at bedside.  Disposition:   Status is: Inpatient  Remains inpatient  appropriate because:Unsafe d/c plan, for residential hospice placement.    Dispo: The patient is from: Home              Anticipated d/c is to: residential hospice.               Anticipated d/c date is: 1 day              Patient currently is medically stable to d/c.       Consultants:   Palliative care.    Procedures: none.    Antimicrobials:none.    Subjective: No complaints.   Objective: Vitals:   03/28/20 0524 03/28/20 0605 03/28/20 2043 03/29/20 1309  BP:  111/67 120/75 122/71  Pulse: 64 65 72 75  Resp:  18 14 18   Temp:  97.6 F (36.4 C) 98.4 F (36.9 C) 98.4 F (36.9 C)  TempSrc:  Oral Oral Oral  SpO2: 98% 98% 95% 100%  Weight:      Height:        Intake/Output Summary (Last 24 hours) at 03/29/2020 1532 Last data filed at 03/29/2020 0730 Gross per 24 hour  Intake 420 ml  Output 950 ml  Net -530 ml   Filed Weights   03/27/20 0111  Weight: 75.2 kg    Examination:  General exam: appears comfortable.  Respiratory system: clear to auscultation, no wheezing heard.  Cardiovascular system: S1S2 heard, RRR, no JVD.  Gastrointestinal system: Abdomen is soft, non tender.  Central nervous system:  Has cognitive developmental delay, able to move all extremities spontaneously.  Extremities: no cyanosis clubbing.  Skin: no rashes seen.  Psychiatry: mood is appropriate.     Data Reviewed: I have personally reviewed following labs and imaging studies  CBC: Recent Labs  Lab 03/25/20 1711 03/26/20 0825 03/27/20 0608  WBC 8.8 7.3 7.4  HGB 13.5 13.1 12.4*  HCT 39.3 38.2* 36.7*  MCV 88.5 89.3 89.5  PLT 279 237 948    Basic Metabolic Panel: Recent Labs  Lab 03/25/20 1711 03/26/20 0825 03/27/20 0608  NA 137 139 138  K 4.4 4.3 4.5  CL 104 106 107  CO2 22 23 22   GLUCOSE 119* 98 97  BUN 19 22 18   CREATININE 1.63* 1.57* 1.18  CALCIUM 9.5 8.9 8.8*    GFR: Estimated Creatinine Clearance: 61.2 mL/min (by C-G formula based on SCr of 1.18  mg/dL).  Liver Function Tests: Recent Labs  Lab 03/25/20 1711 03/26/20 0825 03/27/20 0608  AST 238* 179* 157*  ALT 84* 71* 58*  ALKPHOS 303* 249* 254*  BILITOT 2.3* 1.9* 2.4*  PROT 8.4* 7.5 6.9  ALBUMIN 3.2* 2.6* 2.4*    CBG: No results for input(s): GLUCAP in the last 168 hours.   Recent Results (from the past 240 hour(s))  Resp Panel by RT-PCR (Flu A&B, Covid) Nasopharyngeal Swab     Status: None   Collection Time: 03/26/20  4:24 AM   Specimen: Nasopharyngeal Swab; Nasopharyngeal(NP) swabs in vial transport medium  Result Value Ref Range Status   SARS Coronavirus 2 by RT PCR NEGATIVE NEGATIVE Final    Comment: (NOTE) SARS-CoV-2 target nucleic acids are NOT DETECTED.  The SARS-CoV-2 RNA is generally detectable in upper respiratory specimens during the acute phase of infection. The lowest concentration of SARS-CoV-2 viral copies this assay can detect is 138 copies/mL. A negative result does not preclude SARS-Cov-2 infection and should not be used as the sole basis for treatment or other patient management decisions. A negative result may occur with  improper specimen collection/handling, submission of specimen other than nasopharyngeal swab, presence of viral mutation(s) within the areas targeted by this assay, and inadequate number of viral copies(<138 copies/mL). A negative result must be combined with clinical observations, patient history, and epidemiological information. The expected result is Negative.  Fact Sheet for Patients:  EntrepreneurPulse.com.au  Fact Sheet for Healthcare Providers:  IncredibleEmployment.be  This test is no t yet approved or cleared by the [place] FDA and  has been authorized for detection and/or diagnosis of SARS-CoV-2 by FDA under an Emergency Use Authorization (EUA). This EUA will remain  in effect (meaning this test can be used) for the duration of the COVID-19 declaration under Section  564(b)(1) of the Act, 21 U.S.C.section 360bbb-3(b)(1), unless the authorization is terminated  or revoked sooner.       Influenza A by PCR NEGATIVE NEGATIVE Final   Influenza B by PCR NEGATIVE NEGATIVE Final    Comment: (NOTE) The Xpert Xpress SARS-CoV-2/FLU/RSV plus assay is intended as an aid in the diagnosis of influenza from Nasopharyngeal swab specimens and should not be used as a sole basis for treatment. Nasal washings and aspirates are unacceptable for Xpert Xpress SARS-CoV-2/FLU/RSV testing.  Fact Sheet for Patients: EntrepreneurPulse.com.au  Fact Sheet for Healthcare Providers: IncredibleEmployment.be  This test is not yet approved or cleared by the [place] FDA and has been authorized for detection and/or diagnosis of SARS-CoV-2 by FDA under an Emergency Use Authorization (EUA). This EUA will remain in effect (meaning this test can be used) for the duration of the COVID-19 declaration under Section 564(b)(1)  of the Act, 21 U.S.C. section 360bbb-3(b)(1), unless the authorization is terminated or revoked.  Performed at [facility], [place] [address], [place], [place] [address]          Radiology Studies: No results found.      Scheduled Meds: . carbamazepine  300 mg Oral Daily  . carbamazepine  600 mg Oral q1800  . divalproex  750 mg Oral BID  . lacosamide  100 mg Oral 2 times per day  . latanoprost  1 drop Both Eyes QHS  . levETIRAcetam  500 mg Oral BID  . mouth rinse  15 mL Mouth Rinse BID  . oxyCODONE  5 mg Oral TID AC  . propranolol  10 mg Oral BID  . senna-docusate  1 tablet Oral BID  . sertraline  50 mg Oral Daily   Continuous Infusions:   LOS: 3 days        Hosie Poisson, MD [facility]   To contact the attending provider between 7A-7P or the covering provider during after hours 7P-7A, please log into the web site [email or web address] and access using universal [place]  password for that web site. If you do not have the password, please call the hospital operator.  03/29/2020, 3:32 PM

## 2020-03-30 DIAGNOSIS — N179 Acute kidney failure, unspecified: Secondary | ICD-10-CM | POA: Diagnosis not present

## 2020-03-30 DIAGNOSIS — I81 Portal vein thrombosis: Secondary | ICD-10-CM | POA: Diagnosis not present

## 2020-03-30 NOTE — Progress Notes (Signed)
PROGRESS NOTE    [name]  [id number] DOB: [birth year] DOA: 03/25/2020 PCP: [name], MD    Chief Complaint  Patient presents with  . Abdominal Pain    Brief Narrative:  Roy Young is a 65 y.o. male with a hsitory of developmental delay, OSA, seizure disorder, hypertension, diabetes. 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 Medical oncology consulted with recommendations for comfort measures and hospice care on discharge. Pt seen an examined at bedside, no new complaints. He appears to be in good spirits.    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  Oncology recommends hospice on discharge.  Pain control with oxycodone TID and prn.  Stool softeners and dulcolax prn.  Plan for residential hospice discharge on Monday .    Portal vein obstruction:  Comfort measures. No intervention planned at this time.    Protein calorie malnutrition;  Comfort feeds. Assist with feeding.    AKI;  Resolved with hydration. No new lab work at this time.   OSA:  CPAP  Cognitive developmental delay: Appears stable.    Seizures;  Continue with keppra , carbamazepine, depakote and lacosamide. Currently able to take all meds orally at this time.   Disposition:  Plan for residential hospice placement when bed becomes available.    DVT prophylaxis: SCD'S Code  Status: DNR Family Communication: none at bedside.  Disposition:   Status is: Inpatient  Remains inpatient appropriate because:Unsafe d/c plan, for residential hospice placement.    Dispo: The patient is from: Home              Anticipated d/c is to: residential hospice.               Anticipated d/c date is: 1 day              Patient currently is medically stable to d/c.       Consultants:   Palliative care.    Procedures: none.    Antimicrobials:none.    Subjective: He appears comfortable, not in distress. Smiling.   Objective: Vitals:   03/28/20 2043 03/29/20 1309 03/29/20 2050 03/30/20 0452  BP: 120/75 122/71 109/76 115/68  Pulse: 72 75 77 72  Resp: 14 18 14 14   Temp: 98.4 F (36.9 C) 98.4 F (36.9 C) 97.6 F (36.4 C) (!) 97.3 F (36.3 C)  TempSrc: Oral Oral Oral Oral  SpO2: 95% 100% 99% 99%  Weight:      Height:        Intake/Output Summary (Last 24 hours) at 03/30/2020 1402 Last data filed at 03/30/2020 1051 Gross per 24 hour  Intake 120 ml  Output 600 ml  Net -480 ml   Filed Weights   03/27/20 0111  Weight: 75.2 kg    Examination:  General exam: Alert and comfortable.  Respiratory system: clear to auscultation.  Cardiovascular system: S1S2 heard, RRR Gastrointestinal system: Abdomen is soft,  non tender.  Central nervous system:  Has cognitive developmental delay, able to move all extremities spontaneously.  Extremities: no pedal edema.  Skin: No rashes seen.  Psychiatry: mood is appropriate.     Data Reviewed: I have personally reviewed following labs and imaging studies  CBC: Recent Labs  Lab 03/25/20 1711 03/26/20 0825 03/27/20 0608  WBC 8.8 7.3 7.4  HGB 13.5 13.1 12.4*  HCT 39.3 38.2* 36.7*  MCV 88.5 89.3 89.5  PLT 279 237 621    Basic Metabolic Panel: Recent Labs  Lab 03/25/20 1711 03/26/20 0825 03/27/20 0608  NA 137 139 138  K 4.4 4.3 4.5  CL 104 106 107  CO2 22 23 22   GLUCOSE 119* 98 97  BUN 19 22 18    CREATININE 1.63* 1.57* 1.18  CALCIUM 9.5 8.9 8.8*    GFR: Estimated Creatinine Clearance: 61.2 mL/min (by C-G formula based on SCr of 1.18 mg/dL).  Liver Function Tests: Recent Labs  Lab 03/25/20 1711 03/26/20 0825 03/27/20 0608  AST 238* 179* 157*  ALT 84* 71* 58*  ALKPHOS 303* 249* 254*  BILITOT 2.3* 1.9* 2.4*  PROT 8.4* 7.5 6.9  ALBUMIN 3.2* 2.6* 2.4*    CBG: No results for input(s): GLUCAP in the last 168 hours.   Recent Results (from the past 240 hour(s))  Resp Panel by RT-PCR (Flu A&B, Covid) Nasopharyngeal Swab     Status: None   Collection Time: 03/26/20  4:24 AM   Specimen: Nasopharyngeal Swab; Nasopharyngeal(NP) swabs in vial transport medium  Result Value Ref Range Status   SARS Coronavirus 2 by RT PCR NEGATIVE NEGATIVE Final    Comment: (NOTE) SARS-CoV-2 target nucleic acids are NOT DETECTED.  The SARS-CoV-2 RNA is generally detectable in upper respiratory specimens during the acute phase of infection. The lowest concentration of SARS-CoV-2 viral copies this assay can detect is 138 copies/mL. A negative result does not preclude SARS-Cov-2 infection and should not be used as the sole basis for treatment or other patient management decisions. A negative result may occur with  improper specimen collection/handling, submission of specimen other than nasopharyngeal swab, presence of viral mutation(s) within the areas targeted by this assay, and inadequate number of viral copies(<138 copies/mL). A negative result must be combined with clinical observations, patient history, and epidemiological information. The expected result is Negative.  Fact Sheet for Patients:  EntrepreneurPulse.com.au  Fact Sheet for Healthcare Providers:  IncredibleEmployment.be  This test is no t yet approved or cleared by the [place] FDA and  has been authorized for detection and/or diagnosis of SARS-CoV-2 by FDA under an Emergency Use  Authorization (EUA). This EUA will remain  in effect (meaning this test can be used) for the duration of the COVID-19 declaration under Section 564(b)(1) of the Act, 21 U.S.C.section 360bbb-3(b)(1), unless the authorization is terminated  or revoked sooner.       Influenza A by PCR NEGATIVE NEGATIVE Final   Influenza B by PCR NEGATIVE NEGATIVE Final    Comment: (NOTE) The Xpert Xpress SARS-CoV-2/FLU/RSV plus assay is intended as an aid in the diagnosis of influenza from Nasopharyngeal swab specimens and should not be used as a sole basis for treatment. Nasal washings and aspirates are unacceptable for Xpert Xpress SARS-CoV-2/FLU/RSV testing.  Fact Sheet for Patients: EntrepreneurPulse.com.au  Fact Sheet for Healthcare Providers: IncredibleEmployment.be  This test is not yet approved or cleared by the [place] FDA and has been authorized for detection and/or diagnosis of SARS-CoV-2 by FDA under an Emergency Use Authorization (  EUA). This EUA will remain in effect (meaning this test can be used) for the duration of the COVID-19 declaration under Section 564(b)(1) of the Act, 21 U.S.C. section 360bbb-3(b)(1), unless the authorization is terminated or revoked.  Performed at [facility], [place] [address], [place], [place] [address]          Radiology Studies: No results found.      Scheduled Meds: . carbamazepine  300 mg Oral Daily  . carbamazepine  600 mg Oral q1800  . divalproex  750 mg Oral BID  . lacosamide  100 mg Oral 2 times per day  . latanoprost  1 drop Both Eyes QHS  . levETIRAcetam  500 mg Oral BID  . mouth rinse  15 mL Mouth Rinse BID  . oxyCODONE  5 mg Oral TID AC  . propranolol  10 mg Oral BID  . senna-docusate  1 tablet Oral BID  . sertraline  50 mg Oral Daily   Continuous Infusions:   LOS: 4 days        Hosie Poisson, MD [facility]   To contact the attending provider  between 7A-7P or the covering provider during after hours 7P-7A, please log into the web site [email or web address] and access using universal [place] password for that web site. If you do not have the password, please call the hospital operator.  03/30/2020, 2:02 PM

## 2020-03-31 DIAGNOSIS — N179 Acute kidney failure, unspecified: Secondary | ICD-10-CM | POA: Diagnosis not present

## 2020-03-31 DIAGNOSIS — I81 Portal vein thrombosis: Secondary | ICD-10-CM | POA: Diagnosis not present

## 2020-03-31 NOTE — Progress Notes (Signed)
PROGRESS NOTE    GIULIANO PREECE  LFY:[id number] DOB: 31-Oct-1955 DOA: 03/25/2020 PCP: [name], MD    Chief Complaint  Patient presents with  . Abdominal Pain    Brief Narrative:  CORBYN WILDEY is a 65 y.o. male with a hsitory of developmental delay, OSA, seizure disorder, hypertension, diabetes. 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 Medical oncology consulted with recommendations for comfort measures and hospice care on discharge. PT seen and examined, no new complaints. Appears comfortable, not in distress.    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  Oncology recommends hospice on discharge.  Pain control with oxycodone TID and prn.  Stool softeners and dulcolax prn.  Plan for residential hospice discharge when bed is available.    Portal vein obstruction:  Comfort measures. No intervention planned at this time.    Protein calorie malnutrition;  Comfort feeds. Assist with feeding.     Elevated liver enzymes : probably from liver mets.    AKI;  Resolved with hydration. No new lab work at this time.   OSA:  CPAP  Cognitive developmental delay: Appears stable.    Seizures;  Continue with keppra , carbamazepine, depakote and lacosamide. Currently able to take all meds orally at this time.   Disposition:  Plan for residential hospice placement  when bed becomes available.    DVT prophylaxis: SCD'S Code Status: DNR Family Communication: none at bedside. Discussed with family over the phone and updated.  Disposition:   Status is: Inpatient  Remains inpatient appropriate because:Unsafe d/c plan, for residential hospice placement.    Dispo: The patient is from: Home              Anticipated d/c is to: residential hospice.               Anticipated d/c date is: 1 day              Patient currently is medically stable to d/c.       Consultants:   Palliative care.    Procedures: none.    Antimicrobials:none.    Subjective: Not in distress.    Objective: Vitals:   03/28/20 2043 03/29/20 1309 03/29/20 2050 03/30/20 0452  BP: 120/75 122/71 109/76 115/68  Pulse: 72 75 77 72  Resp: 14 18 14 14   Temp: 98.4 F (36.9 C) 98.4 F (36.9 C) 97.6 F (36.4 C) (!) 97.3 F (36.3 C)  TempSrc: Oral Oral Oral Oral  SpO2: 95% 100% 99% 99%  Weight:      Height:        Intake/Output Summary (Last 24 hours) at 03/31/2020 0847 Last data filed at 03/31/2020 0522 Gross per 24 hour  Intake 120 ml  Output 1100 ml  Net -980 ml   Filed Weights   03/27/20 0111  Weight: 75.2 kg    Examination:  General exam: alert , laying in the bed,  not in distress.  Respiratory system: air entry fair, no wheezing heard.  Cardiovascular system: S1S2 heard, RRR no JVD,  Gastrointestinal system: Abdomen is soft non tender..  Central nervous system:  Has cognitive developmental delay, able to move all extremities spontaneously.  Extremities: no edema.   Skin: No rashes seen.  Psychiatry: cannot be assessed.     Data Reviewed: I have personally reviewed following labs and imaging studies  CBC: Recent Labs  Lab 03/25/20 1711 03/26/20 0825 03/27/20 0608  WBC 8.8 7.3 7.4  HGB 13.5 13.1 12.4*  HCT 39.3 38.2* 36.7*  MCV 88.5 89.3 89.5  PLT 279 237 [id number]    Basic Metabolic Panel: Recent Labs  Lab 03/25/20 1711 03/26/20 0825  03/27/20 0608  NA 137 139 138  K 4.4 4.3 4.5  CL 104 106 107  CO2 22 23 22   GLUCOSE 119* 98 97  BUN 19 22 18   CREATININE 1.63* 1.57* 1.18  CALCIUM 9.5 8.9 8.8*    GFR: Estimated Creatinine Clearance: 61.2 mL/min (by C-G formula based on SCr of 1.18 mg/dL).  Liver Function Tests: Recent Labs  Lab 03/25/20 1711 03/26/20 0825 03/27/20 0608  AST 238* 179* 157*  ALT 84* 71* 58*  ALKPHOS 303* 249* 254*  BILITOT 2.3* 1.9* 2.4*  PROT 8.4* 7.5 6.9  ALBUMIN 3.2* 2.6* 2.4*    CBG: No results for input(s): GLUCAP in the last 168 hours.   Recent Results (from the past 240 hour(s))  Resp Panel by RT-PCR (Flu A&B, Covid) Nasopharyngeal Swab     Status: None   Collection Time: 03/26/20  4:24 AM   Specimen: Nasopharyngeal Swab; Nasopharyngeal(NP) swabs in vial transport medium  Result Value Ref Range Status   SARS Coronavirus 2 by RT PCR NEGATIVE NEGATIVE Final    Comment: (NOTE) SARS-CoV-2 target nucleic acids are NOT DETECTED.  The SARS-CoV-2 RNA is generally detectable in upper respiratory specimens during the acute phase of infection. The lowest concentration of SARS-CoV-2 viral copies this assay can detect is 138 copies/mL. A negative result does not preclude SARS-Cov-2 infection and should not be used as the sole basis for treatment or other patient management decisions. A negative result may occur with  improper specimen collection/handling, submission of specimen other than nasopharyngeal swab, presence of viral mutation(s) within the areas targeted by this assay, and inadequate number of viral copies(<138 copies/mL). A negative result must be combined with clinical observations, patient history, and epidemiological information. The expected result is Negative.  Fact Sheet for Patients:  EntrepreneurPulse.com.au  Fact Sheet for Healthcare Providers:  IncredibleEmployment.be  This test is no t yet approved or cleared by the [place] FDA and  has been authorized for detection and/or diagnosis of SARS-CoV-2 by FDA under an Emergency Use Authorization (EUA). This EUA will remain  in effect (meaning this test can be used) for the duration of the COVID-19 declaration under Section 564(b)(1) of the Act, 21 U.S.C.section 360bbb-3(b)(1), unless the authorization is terminated  or revoked sooner.       Influenza A by PCR NEGATIVE NEGATIVE Final   Influenza B by PCR NEGATIVE NEGATIVE Final    Comment: (NOTE) The Xpert Xpress SARS-CoV-2/FLU/RSV plus assay is intended as an aid in the diagnosis of influenza from Nasopharyngeal swab specimens and should not be used as a sole basis for treatment. Nasal washings and aspirates are unacceptable for Xpert Xpress SARS-CoV-2/FLU/RSV testing.  Fact Sheet for Patients: EntrepreneurPulse.com.au  Fact Sheet for Healthcare Providers: IncredibleEmployment.be  This test is not  yet approved or cleared by the [place] and has been authorized for detection and/or diagnosis of SARS-CoV-2 by FDA under an Emergency Use Authorization (EUA). This EUA will remain in effect (meaning this test can be used) for the duration of the COVID-19 declaration under Section 564(b)(1) of the Act, 21 U.S.C. section 360bbb-3(b)(1), unless the authorization is terminated or revoked.  Performed at [facility], [place] [address] [place], [place] [address]          Radiology Studies: No results found.      Scheduled Meds: . carbamazepine  300 mg Oral Daily  . carbamazepine  600 mg Oral q1800  . divalproex  750 mg Oral BID  . lacosamide  100 mg Oral 2 times per day  . latanoprost  1 drop Both Eyes QHS  . levETIRAcetam  500 mg Oral BID  . mouth rinse  15 mL Mouth Rinse BID  . oxyCODONE  5 mg Oral TID AC  . propranolol  10 mg Oral BID  . senna-docusate  1 tablet Oral BID  . sertraline  50 mg Oral Daily   Continuous  Infusions:   LOS: 5 days        Hosie Poisson, MD [facility]   To contact the attending provider between 7A-7P or the covering provider during after hours 7P-7A, please log into the web site [email or web address] and access using universal [place] password for that web site. If you do not have the password, please call the hospital operator.  03/31/2020, 8:47 AM

## 2020-03-31 NOTE — Progress Notes (Signed)
Roy Young, the tech did not document the patient's output.

## 2020-04-01 DIAGNOSIS — I81 Portal vein thrombosis: Secondary | ICD-10-CM | POA: Diagnosis not present

## 2020-04-01 DIAGNOSIS — N179 Acute kidney failure, unspecified: Secondary | ICD-10-CM | POA: Diagnosis not present

## 2020-04-01 NOTE — TOC Progression Note (Signed)
Transition of Care [facility]) - Progression Note    Patient Details  Name: Roy Young MRN: [id number] Date of Birth: 11-02-1955  Transition of Care [facility]) CM/SW Contact  Joaquin Courts, RN Phone Number: 04/01/2020, 11:52 AM  Clinical Narrative:    CM spoke with [facility] who reports no beds are available at this time.  CM will continue to follow.        Expected Discharge Plan and Services                                                 Social Determinants of Health (SDOH) Interventions    Readmission Risk Interventions No flowsheet data found.

## 2020-04-01 NOTE — Progress Notes (Signed)
PT Cancellation Note  Patient Details Name: Roy Young MRN: [id number] DOB: Mar 21, 1955   Cancelled Treatment:    Reason Eval/Treat Not Completed: Other (comment). Pt is on comfort care measures only. Plan is to transition to Hospice at d/c. PT signing off   [facility] 04/01/2020, 12:35 PM

## 2020-04-01 NOTE — Progress Notes (Signed)
PROGRESS NOTE    Roy Young  NTI:[id number] DOB: 03-23-1955 DOA: 03/25/2020 PCP: [name], MD    Chief Complaint  Patient presents with  . Abdominal Pain    Brief Narrative:  Roy Young is a 65 y.o. male with a hsitory of developmental delay, OSA, seizure disorder, hypertension, diabetes. 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 Medical oncology consulted with recommendations for comfort measures and hospice care on discharge. Pt seen, examined. No new complaints.    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  Oncology recommends hospice on discharge.  Pain control with oxycodone TID and prn.  Stool softeners and dulcolax prn.  Plan for residential hospice discharge when bed is available.    Portal vein obstruction:  Comfort measures. No interventions at this time.    Protein calorie malnutrition;  Comfort feeds. Assist with feeding.     Elevated liver enzymes : probably from liver mets.    AKI;  Resolved. . No new lab work at this time.   OSA:  CPAP  Cognitive developmental delay: Appears stable.    Seizures;  Continue with keppra , carbamazepine, depakote and lacosamide. Currently able to take all meds orally at this time.   Disposition:  Plan for residential hospice placement when bed becomes available.    DVT prophylaxis: SCD'S Code  Status: DNR Family Communication: none at bedside. Discussed with family over the phone and updated on 03/31/20 Disposition:   Status is: Inpatient  Remains inpatient appropriate because:Unsafe d/c plan, for residential hospice placement.    Dispo: The patient is from: Home              Anticipated d/c is to: residential hospice.               Anticipated d/c date is: 1 day              Patient currently is medically stable to d/c.       Consultants:   Palliative care.    Procedures: none.    Antimicrobials:none.    Subjective: Not in distress.   Objective: Vitals:   03/31/20 2251 04/01/20 0543 04/01/20 1212 04/01/20 1509  BP: 122/75 139/85 123/67 132/73  Pulse: 72 72 74 68  Resp: 16 16  16   Temp: 97.9 F (36.6 C) 98 F (36.7 C)  97.9 F (36.6 C)  TempSrc: Oral   Oral  SpO2: 94% 95%  94%  Weight:      Height:        Intake/Output Summary (Last 24 hours) at 04/01/2020 1803 Last data filed at 04/01/2020 1215 Gross per 24 hour  Intake 1400 ml  Output 125 ml  Net 1275 ml   Filed Weights   03/27/20 0111  Weight: 75.2 kg    Examination:  General exam: alert and comfortable.  Respiratory system: Air entry fair no wheezing heard.  Cardiovascular system: S1S2 RRR no  JVD.  Gastrointestinal system: Abdomen is soft , NT ND BS+  Central nervous system:  Has cognitive developmental delay, able to move all extremities spontaneously.  Extremities: NO edema.  Skin: No rashes seen.  Psychiatry: cannot be assessed.     Data Reviewed: I have personally reviewed following labs and imaging studies  CBC: Recent Labs  Lab 03/26/20 0825 03/27/20 0608  WBC 7.3 7.4  HGB 13.1 12.4*  HCT 38.2* 36.7*  MCV 89.3 89.5  PLT 237 010    Basic Metabolic Panel: Recent Labs  Lab 03/26/20 0825 03/27/20 0608  NA 139 138  K 4.3 4.5  CL 106 107  CO2 23 22  GLUCOSE 98 97  BUN 22 18  CREATININE 1.57* 1.18  CALCIUM 8.9 8.8*    GFR: Estimated Creatinine Clearance:  61.2 mL/min (by C-G formula based on SCr of 1.18 mg/dL).  Liver Function Tests: Recent Labs  Lab 03/26/20 0825 03/27/20 0608  AST 179* 157*  ALT 71* 58*  ALKPHOS 249* 254*  BILITOT 1.9* 2.4*  PROT 7.5 6.9  ALBUMIN 2.6* 2.4*    CBG: No results for input(s): GLUCAP in the last 168 hours.   Recent Results (from the past 240 hour(s))  Resp Panel by RT-PCR (Flu A&B, Covid) Nasopharyngeal Swab     Status: None   Collection Time: 03/26/20  4:24 AM   Specimen: Nasopharyngeal Swab; Nasopharyngeal(NP) swabs in vial transport medium  Result Value Ref Range Status   SARS Coronavirus 2 by RT PCR NEGATIVE NEGATIVE Final    Comment: (NOTE) SARS-CoV-2 target nucleic acids are NOT DETECTED.  The SARS-CoV-2 RNA is generally detectable in upper respiratory specimens during the acute phase of infection. The lowest concentration of SARS-CoV-2 viral copies this assay can detect is 138 copies/mL. A negative result does not preclude SARS-Cov-2 infection and should not be used as the sole basis for treatment or other patient management decisions. A negative result may occur with  improper specimen collection/handling, submission of specimen other than nasopharyngeal swab, presence of viral mutation(s) within the areas targeted by this assay, and inadequate number of viral copies(<138 copies/mL). A negative result must be combined with clinical observations, patient history, and epidemiological information. The expected result is Negative.  Fact Sheet for Patients:  EntrepreneurPulse.com.au  Fact Sheet for Healthcare Providers:  IncredibleEmployment.be  This test is no t yet approved or cleared by the [place] FDA and  has been authorized for detection and/or diagnosis of SARS-CoV-2 by FDA under an Emergency Use Authorization (EUA). This EUA will remain  in effect (meaning this test can be used) for the duration of the COVID-19 declaration under  Section 564(b)(1) of the Act, 21 U.S.C.section 360bbb-3(b)(1), unless the authorization is terminated  or revoked sooner.       Influenza A by PCR NEGATIVE NEGATIVE Final   Influenza B by PCR NEGATIVE NEGATIVE Final    Comment: (NOTE) The Xpert Xpress SARS-CoV-2/FLU/RSV plus assay is intended as an aid in the diagnosis of influenza from Nasopharyngeal swab specimens and should not be used as a sole basis for treatment. Nasal washings and aspirates are unacceptable for Xpert Xpress SARS-CoV-2/FLU/RSV testing.  Fact Sheet for Patients: EntrepreneurPulse.com.au  Fact Sheet for Healthcare Providers: IncredibleEmployment.be  This test is not yet approved or cleared by the [place] FDA and has been authorized for detection and/or diagnosis of SARS-CoV-2 by FDA under an Emergency Use Authorization (EUA). This EUA will remain in effect (meaning this test can be used) for the duration of  the COVID-19 declaration under Section 564(b)(1) of the Act, 21 U.S.C. section 360bbb-3(b)(1), unless the authorization is terminated or revoked.  Performed at [facility], [place] [address] [place], [place] [address]          Radiology Studies: No results found.      Scheduled Meds: . carbamazepine  300 mg Oral Daily  . carbamazepine  600 mg Oral q1800  . divalproex  750 mg Oral BID  . lacosamide  100 mg Oral 2 times per day  . latanoprost  1 drop Both Eyes QHS  . levETIRAcetam  500 mg Oral BID  . mouth rinse  15 mL Mouth Rinse BID  . oxyCODONE  5 mg Oral TID AC  . propranolol  10 mg Oral BID  . senna-docusate  1 tablet Oral BID  . sertraline  50 mg Oral Daily   Continuous Infusions:   LOS: 6 days        Hosie Poisson, MD [facility]   To contact the attending provider between 7A-7P or the covering provider during after hours 7P-7A, please log into the web site [email or web address] and access using universal [facility] password for that web site. If you do not have the password, please call the hospital operator.  04/01/2020, 6:03 PM

## 2020-04-02 DIAGNOSIS — D490 Neoplasm of unspecified behavior of digestive system: Secondary | ICD-10-CM | POA: Diagnosis not present

## 2020-04-02 DIAGNOSIS — R932 Abnormal findings on diagnostic imaging of liver and biliary tract: Secondary | ICD-10-CM

## 2020-04-02 DIAGNOSIS — I81 Portal vein thrombosis: Secondary | ICD-10-CM | POA: Diagnosis not present

## 2020-04-02 MED ORDER — PROPRANOLOL HCL 10 MG PO TABS: 10.0000 mg | ORAL_TABLET | Freq: Two times a day (BID) | ORAL | Status: AC

## 2020-04-02 MED ORDER — SENNOSIDES-DOCUSATE SODIUM 8.6-50 MG PO TABS: 1.0000 | ORAL_TABLET | Freq: Two times a day (BID) | ORAL | Status: AC

## 2020-04-02 NOTE — TOC Progression Note (Signed)
Transition of Care [facility]) - Progression Note    Patient Details  Name: Roy Young MRN: [id number] Date of Birth: September 26, 1955  [facility] CM/SW Contact  Jahmani Staup, Marjie Skiff, RN Phone Number: 04/02/2020, 3:52 PM  Clinical Narrative:    Spoke with Cassandra from [facility] who states that there is a pretty long waiting list for beds at their residential hospice. She will call TOC when bed becomes available. Sister of pt called to inform.

## 2020-04-02 NOTE — Discharge Summary (Addendum)
Physician Discharge Summary  Roy Young UQJ:[id number] DOB: [birth year] DOA: 03/25/2020  PCP: Fleet Contras, MD  Admit date: 03/25/2020 Discharge date: 1/20 /2022  Admitted From: Home.  Disposition:  Residential hospice.   Recommendations for Outpatient Follow-up:  1. Follow up with PCP in 1-2 weeks Please follow up with hospice MD as recommended.   Discharge Condition:Hospice.  CODE STATUS: comfort care.  Diet recommendation: comfort feeds.    Brief/Interim Summary:  Roy Sendejo Donnellis a 65 y.o.male with a hsitory of developmental delay, OSA, seizure disorder, hypertension, diabetes. 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 Medical oncology consulted with recommendations for comfort measures and hospice care on discharge.  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  Oncology recommends hospice on discharge.  Pain control with oxycodone TID and prn.  Stool softeners and dulcolax prn.  Plan for residential hospice discharge when bed is available.    Portal vein obstruction:  Comfort measures. No interventions at this time.    Protein calorie malnutrition;  Comfort feeds. Assist with feeding.     Elevated liver enzymes : probably from liver mets.    AKI;  Resolved. . No new lab work at this time.   OSA:  CPAP as needed.  Cognitive developmental  delay: Appears stable.    Seizures;  Continue with keppra , carbamazepine, depakote and lacosamide. Currently able to take all meds orally at this time.   Disposition:  Plan for residential hospice placement when bed becomes available.    Discharge Instructions  Discharge Instructions    Diet - low sodium heart healthy   Complete by: As directed    Increase activity slowly   Complete by: As directed      Allergies as of 04/02/2020   No Known Allergies     Medication List    STOP taking these medications   amoxicillin 875 MG tablet Commonly known as: AMOXIL   benzonatate 200 MG capsule Commonly known as: TESSALON   lovastatin 40 MG tablet Commonly known as: MEVACOR   MULTIVITAMIN ADULT PO   ondansetron 4 MG disintegrating tablet Commonly known as: Zofran ODT   pantoprazole 20 MG tablet Commonly known as: PROTONIX     TAKE these medications   carbamazepine 300 MG 12 hr capsule Commonly known as: CARBATROL Take 300-600 mg by mouth See admin instructions. Take 300 mg in the morning and 600 mg in the evening   divalproex 250 MG 24 hr tablet Commonly known as: DEPAKOTE ER Take 750 mg by mouth 2 (two) times daily.   ergocalciferol 1.25 MG (50000 UT) capsule Commonly known as: VITAMIN D2 Take 50,000 Units by mouth once a week.   Lacosamide 100 MG Tabs Take 100 mg by mouth 2 (two) times daily.   latanoprost 0.005 % ophthalmic solution Commonly known as: XALATAN Place 1 drop into both eyes at bedtime.   levETIRAcetam 500 MG tablet Commonly known as: KEPPRA Take  500 mg by mouth 2 (two) times daily.   loratadine 10 MG tablet Commonly known as: CLARITIN Take 10 mg by mouth daily.   polyvinyl alcohol 1.4 % ophthalmic solution Commonly known as: LIQUIFILM TEARS Place 1 drop into both eyes as needed for dry eyes.   propranolol 10 MG tablet Commonly known as: INDERAL Take 1 tablet (10 mg total) by mouth 2 (two) times daily.   senna-docusate 8.6-50 MG  tablet Commonly known as: Senokot-S Take 1 tablet by mouth 2 (two) times daily.   sertraline 50 MG tablet Commonly known as: ZOLOFT Take 50 mg by mouth daily.       Follow-up Information    [name], MD. Schedule an appointment as soon as possible for a visit in 1 week(s).   Specialty: Internal Medicine Contact information: [address] [place] [address] [phone]              No Known Allergies  Consultations:  Palliative care.    Procedures/Studies: CT ABDOMEN PELVIS W CONTRAST  Result Date: 03/26/2020 CLINICAL DATA:  Acute nonlocalized abdominal pain for 1 week, poor oral intake, emesis, nonverbal patient EXAM: CT ABDOMEN AND PELVIS WITH CONTRAST TECHNIQUE: Multidetector CT imaging of the abdomen and pelvis was performed using the standard protocol following bolus administration of intravenous contrast. CONTRAST:  70mL OMNIPAQUE IOHEXOL 300 MG/ML  SOLN COMPARISON:  None. FINDINGS: Lower chest: Dependent ground-glass in the lungs is favored to reflect atelectatic change. Lung bases otherwise clear. Normal heart size. No pericardial effusion. Coronary artery and aortic leaflet calcifications. Hepatobiliary: Irregular lobular contour of both the left lobe liver and caudate lobe and inferior liver tip with multiple ill-defined hypoattenuating foci within. Additional smaller hypoattenuating foci are present throughout the liver. Right lobe component measures approximately 6.2 by 7.6 cm (6/26). Conglomerate size of the focus in the left lobe measures approximately 4.4 x 7.9 cm. Focus in the caudate measures 9.2 x 6.7 cm (6/7). Additionally, there appears to be enhancing soft tissue extending from the ill-defined focus in the caudate lobe with some hypoattenuating material concerning for malignant tumor thrombus extension into the portal vein. Some resulting expansion of the main portal vein. The left portal vein appears entirely occluded. Partial occlusion of the  proximal right portal and high-grade narrowing/near occlusion of the right anterior portal branches. Lobular, irregular liver surface contour, nonspecific. Some mild intrahepatic biliary ductal dilatation. The gallbladder is partially decompressed some mild wall thickening is nonspecific with pericholecystic inflammation. Difficult to follow the course of the extrahepatic biliary tree given compressive mass effect in the porta hepatis. No visible calcified gallstones. Pancreas: No pancreatic ductal dilatation or surrounding inflammatory changes. Spleen: Normal in size. No concerning splenic lesions. Adrenals/Urinary Tract: Normal adrenals. Kidneys are normally located with symmetric enhancementand excretion. Few fluid attenuation cysts in both kidneys. No suspicious renal lesion, urolithiasis or hydronephrosis. Urinary bladder is unremarkable for degree of distension. Stomach/Bowel: Distal esophagus, stomach and duodenal sweep are unremarkable. No small bowel wall thickening or dilatation. No evidence of obstruction. Appendix is air-filled and nondilated. No colonic dilatation or wall thickening. Redundancy of the sigmoid. No evidence of bowel obstruction. Vascular/Lymphatic: Atherosclerotic plaque throughout the abdominal aorta and branch vessels. Conspicuous adenopathy is seen in the porta hepatis and retroperitoneum including a 10 mm aortocaval node (2/34), and a prominent 7 mm node in the porta hepatis (2/26). Reproductive: The prostate and seminal vesicles are unremarkable. Other: Edematous changes noted in the upper abdomen. No abdominopelvic free air or fluid. No bowel containing  hernias. Musculoskeletal: No acute osseous abnormality or suspicious osseous lesion. Multilevel degenerative changes are present in the imaged portions of the spine. Transitional lumbosacral vertebrae with bony fusion of the adjacent sacral ala and superior SI joints. Additional degenerative changes in the hips and pelvis.  IMPRESSION: 1. Ill-defined hypoattenuating lesions present in the left lobe, caudate and right lobe liver with there are focal internal cystic foci. Appearance is highly concerning for metastatic disease or neoplasm. Additionally, there appears to be tumor thrombus extension into the main portal vein. The left portal vein appears entirely occluded. Partial occlusion of the proximal right portal and high-grade narrowing/near occlusion of the right anterior portal branches. 2. Conspicuous adenopathy in the porta hepatis and retroperitoneum, concerning for metastatic disease. 3. Lobular, irregular liver surface contour, nonspecific could reflect cirrhosis or intrinsic liver disease. 4. Mild gallbladder wall thickening and pericholecystic inflammation, nonspecific given the liver findings. Could correlate with clinical symptoms and consider further evaluation with right upper quadrant ultrasound as clinically indicated. 5. Aortic Atherosclerosis (ICD10-I70.0). These results were called by telephone at the time of interpretation on 03/26/2020 at 2:19 am to provider [facility] , who verbally acknowledged these results. Electronically Signed   By: Lovena Le M.D.   On: 03/26/2020 02:19   US Abdomen Limited RUQ (LIVER/GB)  Result Date: 03/26/2020 CLINICAL DATA:  Abnormal CT. EXAM: ULTRASOUND ABDOMEN LIMITED RIGHT UPPER QUADRANT COMPARISON:  CT 03/26/2020. FINDINGS: Gallbladder: No gallstones or wall thickening visualized. No sonographic Murphy sign noted by sonographer. Common bile duct: Diameter: 2.2 mm Liver: Nodular very heterogeneous hepatic contour suggesting cirrhosis. 5.8 cm mass noted in the right hepatic lobe. Left hepatic and caudate hepatic masses best identified by prior CT. 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 IMPRESSION: 1. No gallstones or biliary distention. 2. Nodular hepatic contour suggesting cirrhosis. 5.8 cm mass noted in the right hepatic lobe. Left hepatic and caudate hepatic  masses best identified by prior CT. 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 Electronically Signed   By: Marcello Moores  Register   On: 03/26/2020 05:53       Subjective:  No new complaints.  Discharge Exam: Vitals:   04/01/20 1212 04/01/20 1509  BP: 123/67 132/73  Pulse: 74 68  Resp:  16  Temp:  97.9 F (36.6 C)  SpO2:  94%   Vitals:   03/31/20 2251 04/01/20 0543 04/01/20 1212 04/01/20 1509  BP: 122/75 139/85 123/67 132/73  Pulse: 72 72 74 68  Resp: 16 16  16   Temp: 97.9 F (36.6 C) 98 F (36.7 C)  97.9 F (36.6 C)  TempSrc: Oral   Oral  SpO2: 94% 95%  94%  Weight:      Height:        General: Pt is alert, awake, not in acute distress Cardiovascular: RRR, S1/S2 +, no rubs, no gallops Respiratory: CTA bilaterally, no wheezing, no rhonchi Abdominal: Soft, NT, ND, bowel sounds + Extremities: no edema, no cyanosis    The results of significant diagnostics from this hospitalization (including imaging, microbiology, ancillary and laboratory) are listed below for reference.     Microbiology: Recent Results (from the past 240 hour(s))  Resp Panel by RT-PCR (Flu A&B, Covid) Nasopharyngeal Swab     Status: None   Collection Time: 03/26/20  4:24 AM   Specimen: Nasopharyngeal Swab; Nasopharyngeal(NP) swabs in vial transport medium  Result Value Ref Range Status   SARS Coronavirus 2 by RT PCR NEGATIVE NEGATIVE Final    Comment: (NOTE) SARS-CoV-2 target  nucleic acids are NOT DETECTED.  The SARS-CoV-2 RNA is generally detectable in upper respiratory specimens during the acute phase of infection. The lowest concentration of SARS-CoV-2 viral copies this assay can detect is 138 copies/mL. A negative result does not preclude SARS-Cov-2 infection and should not be used as the sole basis for treatment or other patient management decisions. A negative result may occur with  improper specimen collection/handling, submission of specimen other than nasopharyngeal swab,  presence of viral mutation(s) within the areas targeted by this assay, and inadequate number of viral copies(<138 copies/mL). A negative result must be combined with clinical observations, patient history, and epidemiological information. The expected result is Negative.  Fact Sheet for Patients:  EntrepreneurPulse.com.au  Fact Sheet for Healthcare Providers:  IncredibleEmployment.be  This test is no t yet approved or cleared by the [place] FDA and  has been authorized for detection and/or diagnosis of SARS-CoV-2 by FDA under an Emergency Use Authorization (EUA). This EUA will remain  in effect (meaning this test can be used) for the duration of the COVID-19 declaration under Section 564(b)(1) of the Act, 21 U.S.C.section 360bbb-3(b)(1), unless the authorization is terminated  or revoked sooner.       Influenza A by PCR NEGATIVE NEGATIVE Final   Influenza B by PCR NEGATIVE NEGATIVE Final    Comment: (NOTE) The Xpert Xpress SARS-CoV-2/FLU/RSV plus assay is intended as an aid in the diagnosis of influenza from Nasopharyngeal swab specimens and should not be used as a sole basis for treatment. Nasal washings and aspirates are unacceptable for Xpert Xpress SARS-CoV-2/FLU/RSV testing.  Fact Sheet for Patients: EntrepreneurPulse.com.au  Fact Sheet for Healthcare Providers: IncredibleEmployment.be  This test is not yet approved or cleared by the [place] FDA and has been authorized for detection and/or diagnosis of SARS-CoV-2 by FDA under an Emergency Use Authorization (EUA). This EUA will remain in effect (meaning this test can be used) for the duration of the COVID-19 declaration under Section 564(b)(1) of the Act, 21 U.S.C. section 360bbb-3(b)(1), unless the authorization is terminated or revoked.  Performed at [facility], [place] [address] [place], [place] [address]       Labs: BNP (last 3 results) No results for input(s): BNP in the last 8760 hours. Basic Metabolic Panel: Recent Labs  Lab 03/27/20 0608  NA 138  K 4.5  CL 107  CO2 22  GLUCOSE 97  BUN 18  CREATININE 1.18  CALCIUM 8.8*   Liver Function Tests: Recent Labs  Lab 03/27/20 0608  AST 157*  ALT 58*  ALKPHOS 254*  BILITOT 2.4*  PROT 6.9  ALBUMIN 2.4*   No results for input(s): LIPASE, AMYLASE in the last 168 hours. No results for input(s): AMMONIA in the last 168 hours. CBC: Recent Labs  Lab 03/27/20 0608  WBC 7.4  HGB 12.4*  HCT 36.7*  MCV 89.5  PLT 212   Cardiac Enzymes: No results for input(s): CKTOTAL, CKMB, CKMBINDEX, TROPONINI in the last 168 hours. BNP: Invalid input(s): POCBNP CBG: No results for input(s): GLUCAP in the last 168 hours. D-Dimer No results for input(s): DDIMER in the last 72 hours. Hgb A1c No results for input(s): HGBA1C in the last 72 hours. Lipid Profile No results for input(s): CHOL, HDL, LDLCALC, TRIG, CHOLHDL, LDLDIRECT in the last 72 hours. Thyroid function studies No results for input(s): TSH, T4TOTAL, T3FREE, THYROIDAB in the last 72 hours.  Invalid input(s): FREET3 Anemia work up No results for input(s): VITAMINB12, FOLATE, FERRITIN, TIBC, IRON, RETICCTPCT in  the last 72 hours. Urinalysis    Component Value Date/Time   COLORURINE AMBER (A) 03/26/2020 0044   APPEARANCEUR CLEAR 03/26/2020 0044   LABSPEC 1.024 03/26/2020 0044   PHURINE 6.0 03/26/2020 0044   GLUCOSEU NEGATIVE 03/26/2020 0044   HGBUR NEGATIVE 03/26/2020 0044   BILIRUBINUR NEGATIVE 03/26/2020 0044   KETONESUR NEGATIVE 03/26/2020 0044   PROTEINUR 100 (A) 03/26/2020 0044   NITRITE NEGATIVE 03/26/2020 0044   LEUKOCYTESUR NEGATIVE 03/26/2020 0044   Sepsis Labs Invalid input(s): PROCALCITONIN,  WBC,  LACTICIDVEN Microbiology Recent Results (from the past 240 hour(s))  Resp Panel by RT-PCR (Flu A&B, Covid) Nasopharyngeal Swab     Status: None   Collection Time:  03/26/20  4:24 AM   Specimen: Nasopharyngeal Swab; Nasopharyngeal(NP) swabs in vial transport medium  Result Value Ref Range Status   SARS Coronavirus 2 by RT PCR NEGATIVE NEGATIVE Final    Comment: (NOTE) SARS-CoV-2 target nucleic acids are NOT DETECTED.  The SARS-CoV-2 RNA is generally detectable in upper respiratory specimens during the acute phase of infection. The lowest concentration of SARS-CoV-2 viral copies this assay can detect is 138 copies/mL. A negative result does not preclude SARS-Cov-2 infection and should not be used as the sole basis for treatment or other patient management decisions. A negative result may occur with  improper specimen collection/handling, submission of specimen other than nasopharyngeal swab, presence of viral mutation(s) within the areas targeted by this assay, and inadequate number of viral copies(<138 copies/mL). A negative result must be combined with clinical observations, patient history, and epidemiological information. The expected result is Negative.  Fact Sheet for Patients:  EntrepreneurPulse.com.au  Fact Sheet for Healthcare Providers:  IncredibleEmployment.be  This test is no t yet approved or cleared by the [place] FDA and  has been authorized for detection and/or diagnosis of SARS-CoV-2 by FDA under an Emergency Use Authorization (EUA). This EUA will remain  in effect (meaning this test can be used) for the duration of the COVID-19 declaration under Section 564(b)(1) of the Act, 21 U.S.C.section 360bbb-3(b)(1), unless the authorization is terminated  or revoked sooner.       Influenza A by PCR NEGATIVE NEGATIVE Final   Influenza B by PCR NEGATIVE NEGATIVE Final    Comment: (NOTE) The Xpert Xpress SARS-CoV-2/FLU/RSV plus assay is intended as an aid in the diagnosis of influenza from Nasopharyngeal swab specimens and should not be used as a sole basis for treatment. Nasal washings  and aspirates are unacceptable for Xpert Xpress SARS-CoV-2/FLU/RSV testing.  Fact Sheet for Patients: EntrepreneurPulse.com.au  Fact Sheet for Healthcare Providers: IncredibleEmployment.be  This test is not yet approved or cleared by the [place] FDA and has been authorized for detection and/or diagnosis of SARS-CoV-2 by FDA under an Emergency Use Authorization (EUA). This EUA will remain in effect (meaning this test can be used) for the duration of the COVID-19 declaration under Section 564(b)(1) of the Act, 21 U.S.C. section 360bbb-3(b)(1), unless the authorization is terminated or revoked.  Performed at [facility], [place] [address], [place], [place] [address]      Time coordinating discharge: 35 minutes.   SIGNED:   Hosie Poisson, MD  [facility]

## 2020-04-02 NOTE — Progress Notes (Signed)
Patient awaiting a hospice facility. I have discussed his case with [facility]-He has been accepted at [facility] is aware of this patient and [facility] RN Liaison has spoken to family. They have not chosen [facility]. They had bed availability last Friday but family wanted to be present when he was moved-they were supposed to arrive yesterday -I will contact family and see what the current status is for them. Will also touch base with [facility] liaison and check on the status of his residential hospice bed.  He continues to be weak, difficulty with eating, minimal PO intake, abdominal pain and distention.  Lane Hacker, DO Palliative Medicine  Time: 15 min Greater than 50%  of this time was spent counseling and coordinating care related to the above assessment and plan.

## 2020-04-02 NOTE — Care Management Important Message (Signed)
Important Message  Patient Details IM Letter given to the Patient. Name: Roy Young MRN: [id number] Date of Birth: July 05, 1955   Medicare Important Message Given:  Yes     Kerin Salen 04/02/2020, 10:44 AM

## 2020-04-03 DIAGNOSIS — R160 Hepatomegaly, not elsewhere classified: Secondary | ICD-10-CM

## 2020-04-03 DIAGNOSIS — I81 Portal vein thrombosis: Secondary | ICD-10-CM | POA: Diagnosis not present

## 2020-04-03 LAB — CEA: CEA: 1.4 ng/mL (ref 0.0–4.7)

## 2020-04-03 LAB — AFP TUMOR MARKER

## 2020-04-03 LAB — CANCER ANTIGEN 19-9: CA 19-9: 51 U/mL — ABNORMAL HIGH (ref 0–35)

## 2020-04-03 MED ORDER — CARBAMAZEPINE 100 MG/5ML PO SUSP
300.0000 mg | Freq: Three times a day (TID) | ORAL | Status: DC
  Administered 2020-04-03 – 2020-04-04 (×4): 300 mg via ORAL
  Filled 2020-04-03 (×6): qty 15

## 2020-04-03 MED ORDER — LACOSAMIDE 10 MG/ML PO SOLN
100.0000 mg | Freq: Two times a day (BID) | ORAL | Status: DC
Start: 2020-04-03 — End: 2020-04-03

## 2020-04-03 MED ORDER — VALPROIC ACID 250 MG/5ML PO SOLN
750.0000 mg | Freq: Two times a day (BID) | ORAL | Status: DC
  Administered 2020-04-03 – 2020-04-04 (×3): 750 mg via ORAL
  Filled 2020-04-03 (×3): qty 15

## 2020-04-03 MED ORDER — SODIUM CHLORIDE 0.9 % IV SOLN
100.0000 mg | Freq: Two times a day (BID) | INTRAVENOUS | Status: DC
  Administered 2020-04-03 – 2020-04-04 (×2): 100 mg via INTRAVENOUS
  Filled 2020-04-03 (×3): qty 10

## 2020-04-03 MED ORDER — LEVETIRACETAM 100 MG/ML PO SOLN
500.0000 mg | Freq: Two times a day (BID) | ORAL | Status: DC
  Administered 2020-04-03 – 2020-04-04 (×3): 500 mg via ORAL
  Filled 2020-04-03 (×3): qty 5

## 2020-04-03 NOTE — Progress Notes (Signed)
[facility]  PROGRESS NOTE  SOTA HETZ ZSW:[id number] DOB: 06-Mar-1956 DOA: 03/25/2020 PCP: [name], MD   Brief HPI:   65 year old male with history of developmental delay, OSA, seizure disorder, hypertension, diabetes mellitus type 2 presented with abdominal pain and decreased oral intake. 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 Oncology was consulted and comfort care with hospice was recommended.    Subjective   Patient seen and examined, denies any complaints.   Assessment/Plan:     1. Widespread metastatic disease-patient was found to have liver lesions with potential obstruction from tumor thrombus, seen on CT abdomen/pelvis. Oncology was consulted, patient made full comfort measures only. Plan to discharge to residential hospice. 2. Portal vein obstruction-comfort measures. No interventions recommended at this time. 3. Acute kidney injury-resolved. 4. Seizures-continue Keppra, carbamazepine, Depakote, lacosamide.     COVID-19 Labs  No results for input(s): DDIMER, FERRITIN, LDH, CRP in the last 72 hours.  Lab Results  Component Value Date   SARSCOV2NAA NEGATIVE 03/26/2020   [place] NEGATIVE 12/26/2018     Scheduled medications:   . carBAMazepine  300 mg Oral TID  . latanoprost  1 drop Both Eyes QHS  . levETIRAcetam  500 mg Oral BID  . mouth rinse  15 mL Mouth Rinse BID  . oxyCODONE  5 mg Oral TID AC  . propranolol  10 mg Oral BID  . senna-docusate  1 tablet Oral BID  . sertraline  50 mg Oral Daily  . valproic acid  750 mg Oral BID         CBG: No results for input(s): GLUCAP in the last 168 hours.  SpO2: 97 %    CBC: No results for input(s): WBC, NEUTROABS, HGB, HCT, MCV, PLT in the last 168 hours.  Basic Metabolic Panel: No results for input(s): NA, K, CL, CO2, GLUCOSE, BUN, CREATININE, CALCIUM, MG, PHOS in the last 168 hours.   Liver  Function Tests: No results for input(s): AST, ALT, ALKPHOS, BILITOT, PROT, ALBUMIN in the last 168 hours.   Antibiotics: Anti-infectives (From admission, onward)   None       DVT prophylaxis: Comfort measures only  Code Status: Full code  Family Communication: No family at bedside   Consultants:  Oncology  Palliative care  Procedures:      Objective   Vitals:   04/01/20 1212 04/01/20 1509 04/02/20 1555 04/03/20 0027  BP: 123/67 132/73 130/76 (!) 147/77  Pulse: 74 68 71 66  Resp:  16 17 16   Temp:  97.9 F (36.6 C) 97.7 F (36.5 C) 97.8 F (36.6 C)  TempSrc:  Oral Oral Oral  SpO2:  94% 96% 97%  Weight:      Height:        Intake/Output Summary (Last 24 hours) at 04/03/2020 1435 Last data filed at 04/03/2020 0830 Gross per 24 hour  Intake --  Output 500 ml  Net -500 ml    01/17 1901 - 01/19 0700 In: 120 [P.O.:120] Out: 800 [Urine:800]  Filed Weights   03/27/20 0111  Weight: 75.2 kg    Physical Examination:    General-appears in no acute distress  Heart-S1-S2, regular, no murmur auscultated  Lungs-clear to auscultation bilaterally, no wheezing or crackles auscultated  Abdomen-soft, nontender, no organomegaly  Extremities-no edema in the lower extremities  Neuro-alert, oriented x3, no focal deficit noted   Status is: Inpatient  Dispo: The patient is from: Home  Anticipated d/c is to: Residential hospice              Anticipated d/c date is: April 04, 2020              Patient currently stable for discharge  Barrier to discharge-awaiting bed at residential hospice       Data Reviewed:   Recent Results (from the past 240 hour(s))  Resp Panel by RT-PCR (Flu A&B, Covid) Nasopharyngeal Swab     Status: None   Collection Time: 03/26/20  4:24 AM   Specimen: Nasopharyngeal Swab; Nasopharyngeal(NP) swabs in vial transport medium  Result Value Ref Range Status   SARS Coronavirus 2 by RT PCR NEGATIVE NEGATIVE Final     Comment: (NOTE) SARS-CoV-2 target nucleic acids are NOT DETECTED.  The SARS-CoV-2 RNA is generally detectable in upper respiratory specimens during the acute phase of infection. The lowest concentration of SARS-CoV-2 viral copies this assay can detect is 138 copies/mL. A negative result does not preclude SARS-Cov-2 infection and should not be used as the sole basis for treatment or other patient management decisions. A negative result may occur with  improper specimen collection/handling, submission of specimen other than nasopharyngeal swab, presence of viral mutation(s) within the areas targeted by this assay, and inadequate number of viral copies(<138 copies/mL). A negative result must be combined with clinical observations, patient history, and epidemiological information. The expected result is Negative.  Fact Sheet for Patients:  EntrepreneurPulse.com.au  Fact Sheet for Healthcare Providers:  IncredibleEmployment.be  This test is no t yet approved or cleared by the [place] FDA and  has been authorized for detection and/or diagnosis of SARS-CoV-2 by FDA under an Emergency Use Authorization (EUA). This EUA will remain  in effect (meaning this test can be used) for the duration of the COVID-19 declaration under Section 564(b)(1) of the Act, 21 U.S.C.section 360bbb-3(b)(1), unless the authorization is terminated  or revoked sooner.       Influenza A by PCR NEGATIVE NEGATIVE Final   Influenza B by PCR NEGATIVE NEGATIVE Final    Comment: (NOTE) The Xpert Xpress SARS-CoV-2/FLU/RSV plus assay is intended as an aid in the diagnosis of influenza from Nasopharyngeal swab specimens and should not be used as a sole basis for treatment. Nasal washings and aspirates are unacceptable for Xpert Xpress SARS-CoV-2/FLU/RSV testing.  Fact Sheet for Patients: EntrepreneurPulse.com.au  Fact Sheet for Healthcare  Providers: IncredibleEmployment.be  This test is not yet approved or cleared by the [place] FDA and has been authorized for detection and/or diagnosis of SARS-CoV-2 by FDA under an Emergency Use Authorization (EUA). This EUA will remain in effect (meaning this test can be used) for the duration of the COVID-19 declaration under Section 564(b)(1) of the Act, 21 U.S.C. section 360bbb-3(b)(1), unless the authorization is terminated or revoked.  Performed at [facility], [place] [address] [place], [place] [address]         Oswald Hillock   [facility] If 7PM-7AM, please contact night-coverage at [email or web address], Office  [phone]   04/03/2020, 2:35 PM  LOS: 8 days

## 2020-04-03 NOTE — Progress Notes (Signed)
   Telephone call to the pt's sister Helene Kelp. She was going to visit yesterday but due to whether it was delayed until today. I discussed with her the pt's decline, weakness, and poor appetite. She stated that she could see how weak and lethargic he was visiting. They are in agreement for pt to transfer to the [facility]. WE will have availability to take him tomorrow morning. She will do paperwork today. I have talked with CM Alinda Sierras to update her as well.   Webb Silversmith RN [phone]

## 2020-04-04 NOTE — Discharge Instructions (Signed)
Portal Vein Thrombosis  Portal vein thrombosis (PVT) is a blockage from a blood clot in the vein that carries blood from the intestines to the liver (portal vein). PVT can also develop in the branches of the portal vein. The clot may form quickly or develop over time. PVT may slow down or completely stop blood flow. PVT is treatable, but it can be life-threatening. What are the causes? PVT is caused by a blood clot. In many cases, the cause of the clot is not known. What increases the risk? You are at risk of PVT if you have any condition that slows down blood flow or increases the clotting of blood. These conditions include:  Scarring of the liver (cirrhosis).  Cancer, especially of the liver or pancreas.  Infections of the pancreas or gallbladder.  Blood-clotting disorders.  Surgery or injury of the abdomen. What are the signs or symptoms? PVT often does not cause signs or symptoms. In some cases, you may have gastrointestinal (GI) bleeding from a backup of blood flow because of the blockage. This is caused by the veins that have become wide (dilated). Other signs and symptoms of PVT may include:  Pain in the abdomen.  Nausea or vomiting.  Swelling of the abdomen from too much fluid (ascites).  Fever.  Enlarged spleen.  Gastrointestinal (GI) bleeding from swollen blood vessels in the esophagus or stomach. If this happens, you may: ? Vomit blood. ? Have bloody diarrhea. ? Have black, tarry stools. How is this diagnosed? This condition may be diagnosed based on your symptoms and risk factors. Your health care provider will do:  A physical exam.  Imaging studies of your abdomen. These may include ultrasound, CT scan, or MRI.  Tests to check for liver function or infection. These are also done to confirm the diagnosis. How is this treated? Treatment of PVT depends on the cause and severity of your condition. It may also include treatment for any underlying conditions. This  condition may be treated with:  Medicines to: ? Break up a blood clot. ? Prevent clotting (anticoagulants). You may first get anticoagulant injections and then continue with anticoagulant pills for several months. ? Lower your blood pressure. ? Improve blood flow to your liver (octreotide).  Surgery to: ? Stop bleeding in the stomach or esophagus. This procedure is usually done using a scope passed through your mouth (endoscopic surgery). ? Restore blood flow through the blood clot, or around it (shunt surgery).  An organ transplant to replace the damaged liver with a healthy liver from a donor. This is done in very severe cases of liver damage. Follow these instructions at home: Medicines  Take over-the-counter and prescription medicines only as told by your health care provider.  If you were prescribed an antibiotic medicine, take it as told by your health care provider. Do not stop using the antibiotic even if you start to feel better. Blood Thinners If you are taking blood thinners:  Talk with your health care provider before you take any medicines that contain aspirin or NSAIDs. These medicines increase your risk for dangerous bleeding.  Get approval from your health care provider before you start taking any new medicines, vitamins, or herbal products. Some of these could interfere with your therapy.  Take your medicine exactly as told, at the same time every day.  Avoid activities that could cause injury or bruising, and follow instructions about how to prevent falls.  Wear a medical alert bracelet or carry a card that lists  what medicines you take. Your health care provider may ask you to:  Have blood tests done regularly so that your medicines may be changed if needed.  Limit foods that have vitamin K. Vitamin K affects how some blood thinners work in the body. ? Some common foods that contain high amounts of vitamin K include kale, spinach, and broccoli. ? Work with a  diet and nutrition specialist (dietitian) to make an eating plan that is right for you. Lifestyle  Eat foods that are high in fiber, such as fresh fruits and vegetables, whole grains, and beans.  Limit foods that are high in fat and processed sugars, such as fried and sweet foods.  Do not use any products that contain nicotine or tobacco, such as cigarettes, e-cigarettes, and chewing tobacco. If you need help quitting, ask your health care provider.  Do not drink alcohol. Alcohol can damage your liver.  Ask your health care provider if you have other fluid or diet restrictions.   General instructions  Return to your normal activities as told by your health care provider. Ask your health care provider what activities are safe for you.  Keep all follow-up visits as told by your health care provider. This is important.  You may be asked to have regular blood tests if you are taking blood thinners. Contact a health care provider if:  You have chills or a fever.  You have any signs or symptoms that get worse or come back.  There is blood in your stool.  You have black, tarry stools. Get help right away if:  You vomit blood.  You have fresh blood or blood clots in your stool. Summary  Portal vein thrombosis (PVT) is a blockage from a blood clot in the vein that carries blood from your intestines to your liver (portal vein). Any condition that slows down blood flow or increases blood clotting can cause PVT.  PVT often does not cause signs or symptoms but may cause gastrointestinal bleeding, which may cause a backup of blood.  Sometimes, PVT can cause swelling in the abdomen from fluid buildup (ascites), pain in the abdomen, and bleeding from the esophagus or stomach.  Treatment of PVT depends on the cause and severity of your condition. Treatment may include medicines and sometimes surgery.  You may be asked to limit your intake of foods that have vitamin K. Vitamin K may affect  the way blood thinners work in the body. This information is not intended to replace advice given to you by your health care provider. Make sure you discuss any questions you have with your health care provider. Document Revised: 03/23/2018 Document Reviewed: 03/18/2018 Elsevier Patient Education  [place].

## 2020-04-04 NOTE — Progress Notes (Signed)
Report called to Jenny Reichmann, Therapist, sports at [facility].

## 2020-04-04 NOTE — Progress Notes (Signed)
   The paper work from his sister has been signed and return I have spoke to the CM today Alinda Sierras and she will arrange ambulance for first available when pt discharged.   Webb Silversmith RN BSN [facility] [phone]

## 2020-04-04 NOTE — TOC Transition Note (Signed)
Transition of Care [facility]) - CM/SW Discharge Note   Patient Details  Name: Roy Young MRN: [id number] Date of Birth: 1955/10/03  [facility] CM/SW Contact:  Lynnell Catalan, RN Phone Number: 04/04/2020, 11:40 AM   Clinical Narrative:     Pt to dc to [facility] today. RN is calling family to inform of dc. Yellow DNR on chart for transport. [facility] contacted. RN to call report to [phone].

## 2020-04-17 ENCOUNTER — Ambulatory Visit: Payer: Medicare HMO | Admitting: Pulmonary Disease

## 2020-06-14 DEATH — deceased

## 2020-11-28 ENCOUNTER — Encounter (INDEPENDENT_AMBULATORY_CARE_PROVIDER_SITE_OTHER): Payer: Medicare HMO | Admitting: Ophthalmology

## 2020-11-28 ENCOUNTER — Encounter (INDEPENDENT_AMBULATORY_CARE_PROVIDER_SITE_OTHER): Payer: Self-pay

## 2022-04-20 IMAGING — CT CT ABD-PELV W/ CM
2 of 5 series · 14 of 46 positions shown, 16 images · IV contrast (OMNIPAQUE 300)
Comparison: None.

CLINICAL DATA: Acute nonlocalized abdominal pain for 1 week, poor
oral intake, emesis, nonverbal patient

EXAM:
CT ABDOMEN AND PELVIS WITH CONTRAST
TECHNIQUE: Multidetector CT imaging of the abdomen and pelvis was performed
using the standard protocol following bolus administration of
intravenous contrast.
CONTRAST:  80mL OMNIPAQUE IOHEXOL 300 MG/ML  SOLN

[Series 2: axial st · axial · 0.68mm/px · z∈[-627,-237]mm · 11 of 88 slices shown, 13 images]
[im 5/88  soft-tissue]
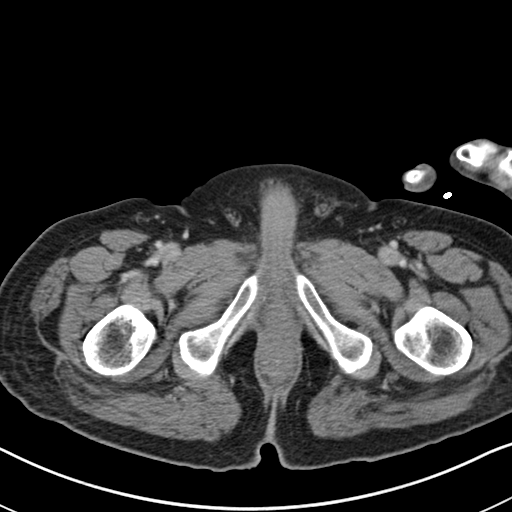
[im 5/88  bone]
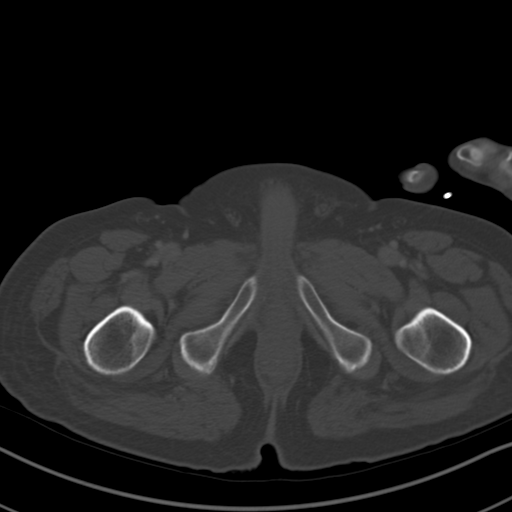
[im 15/88  soft-tissue]
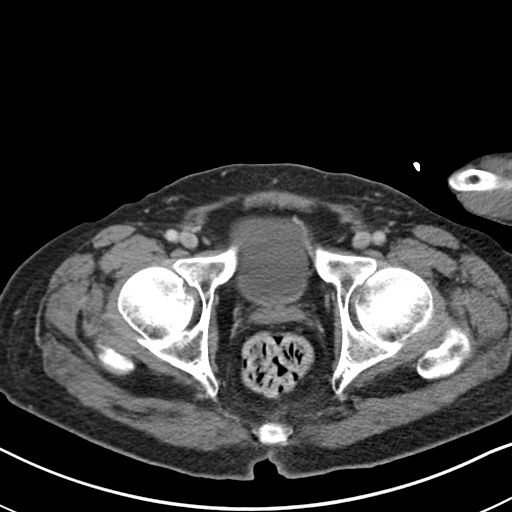
[im 20/88  soft-tissue]
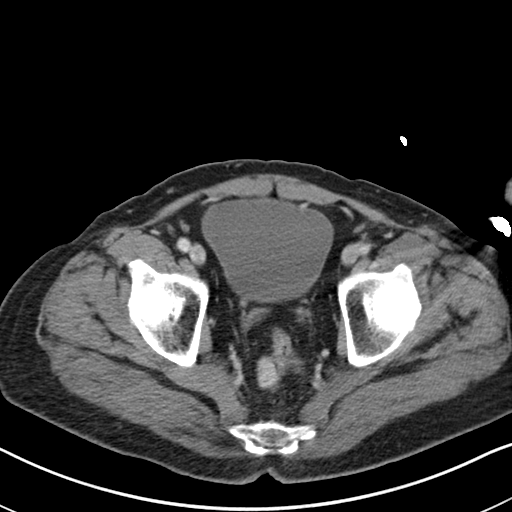
[im 30/88  soft-tissue]
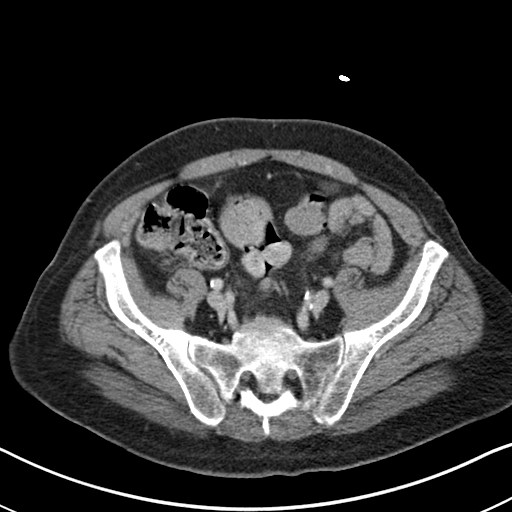
[im 34/88  soft-tissue]
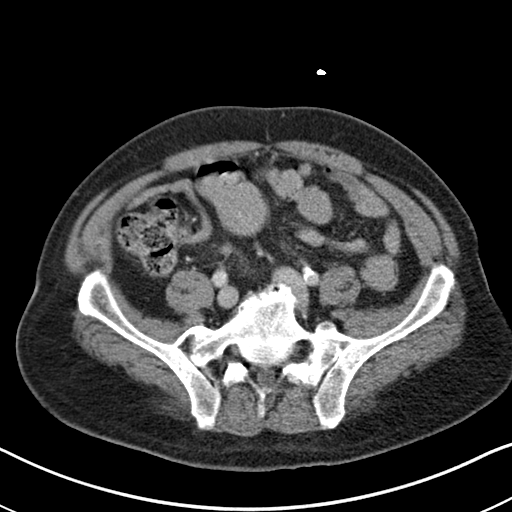
[im 44/88  soft-tissue]
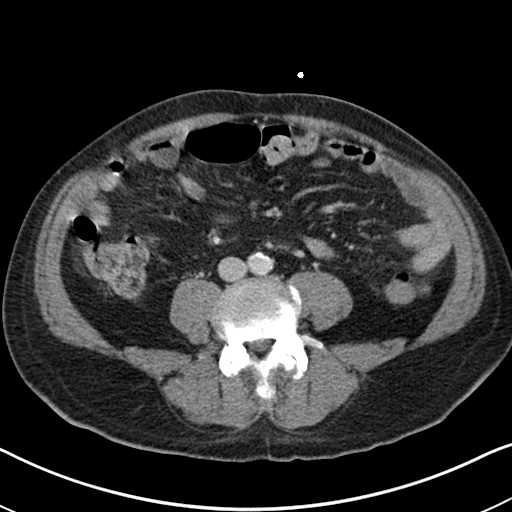
[im 54/88  soft-tissue]
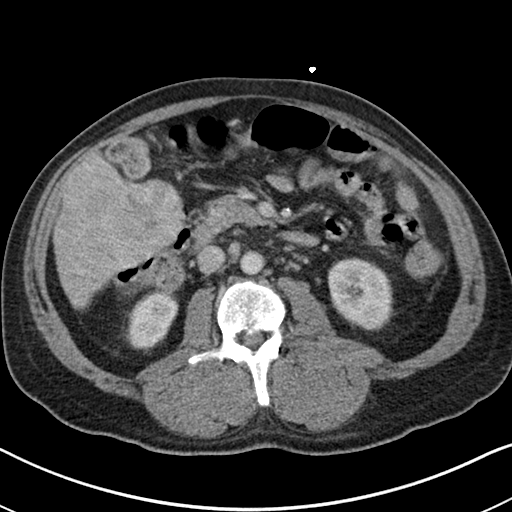
[im 59/88  soft-tissue]
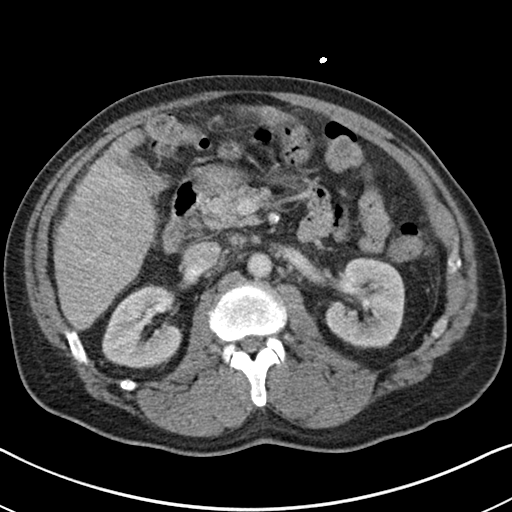
[im 68/88  soft-tissue]
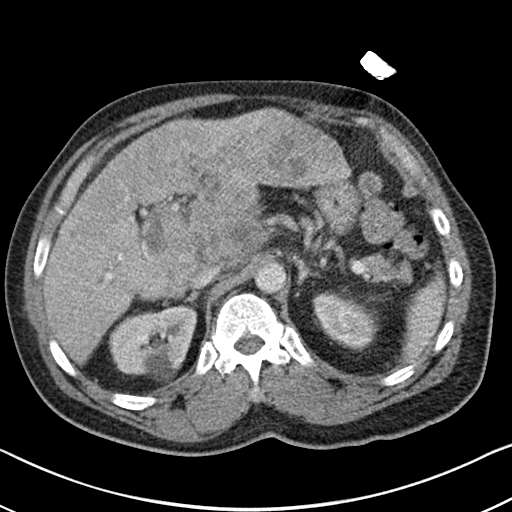
[im 68/88  bone]
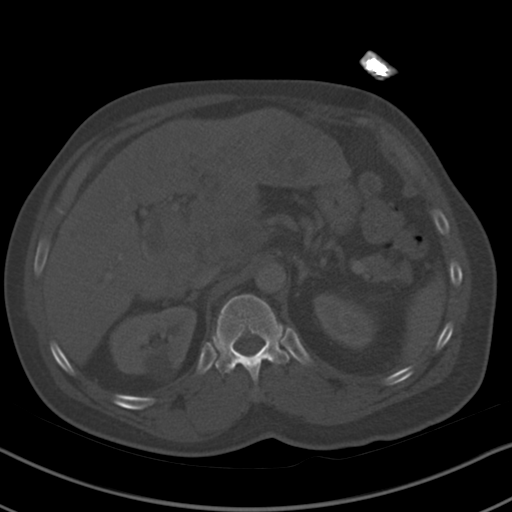
[im 73/88  soft-tissue]
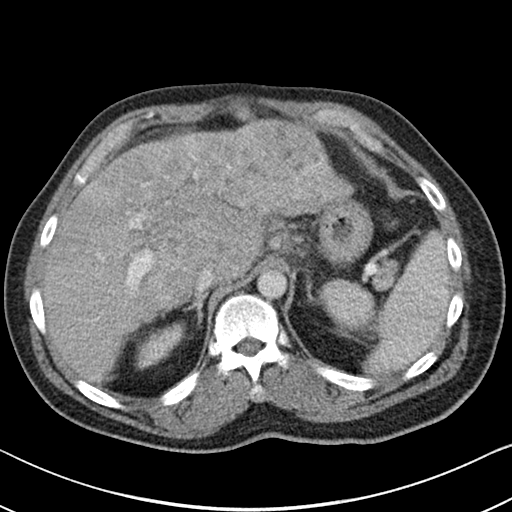
[im 83/88  soft-tissue]
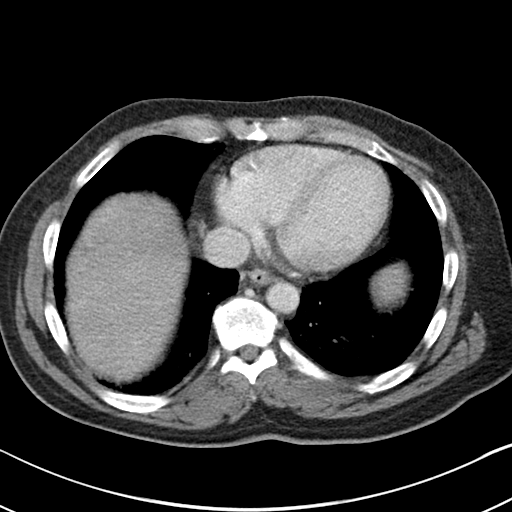

[Series 4: coronal st · coronal · 0.68mm/px · 3 of 135 slices shown]
[im 45/135  soft-tissue]
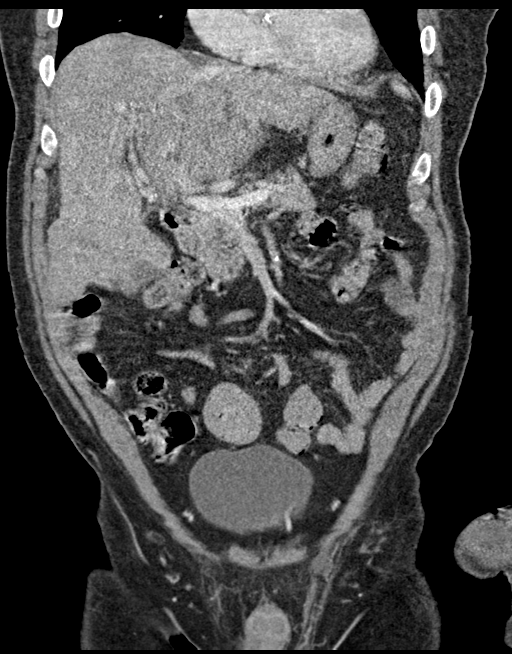
[im 60/135  soft-tissue]
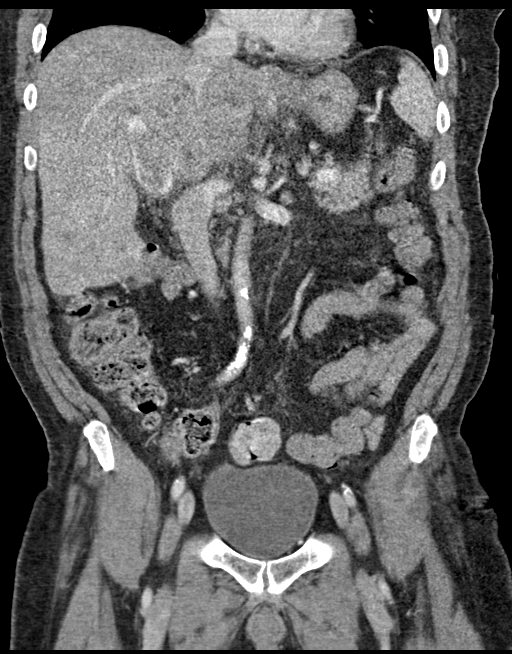
[im 75/135  soft-tissue]
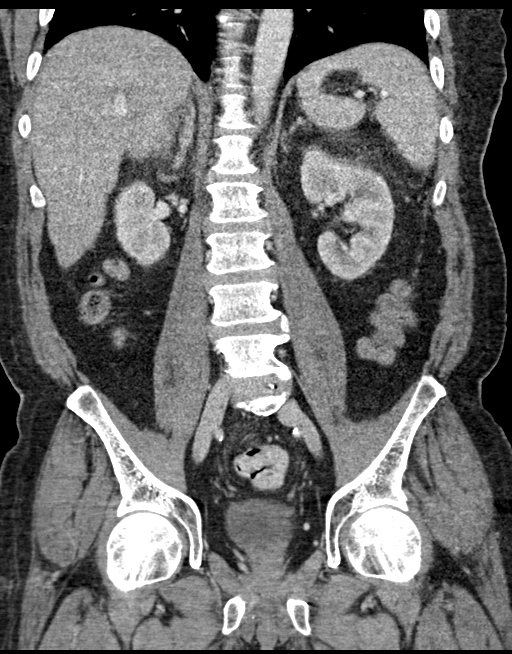

[14 of 46 positions shown; findings below may reference images not displayed]

FINDINGS: Lower chest: Dependent ground-glass in the lungs is favored to
reflect atelectatic change. Lung bases otherwise clear. Normal heart
size. No pericardial effusion. Coronary artery and aortic leaflet
calcifications.

Hepatobiliary: Irregular lobular contour of both the left lobe liver
and caudate lobe and inferior liver tip with multiple ill-defined
hypoattenuating foci within. Additional smaller hypoattenuating foci
are present throughout the liver. Right lobe component measures
approximately 6.2 by 7.6 cm ([DATE]). Conglomerate size of the focus
in the left lobe measures approximately 4.4 x 7.9 cm. Focus in the
caudate measures 9.2 x 6.7 cm ([DATE]). Additionally, there appears to
be enhancing soft tissue extending from the ill-defined focus in the
caudate lobe with some hypoattenuating material concerning for
malignant tumor thrombus extension into the portal vein. Some
resulting expansion of the main portal vein. The left portal vein
appears entirely occluded. Partial occlusion of the proximal right
portal and high-grade narrowing/near occlusion of the right anterior
portal branches. Lobular, irregular liver surface contour,
nonspecific. Some mild intrahepatic biliary ductal dilatation.

The gallbladder is partially decompressed some mild wall thickening
is nonspecific with pericholecystic inflammation. Difficult to
follow the course of the extrahepatic biliary tree given compressive
mass effect in the porta hepatis. No visible calcified gallstones.

Pancreas: No pancreatic ductal dilatation or surrounding
inflammatory changes.

Spleen: Normal in size. No concerning splenic lesions.

Adrenals/Urinary Tract: Normal adrenals. Kidneys are normally
located with symmetric enhancementand excretion. Few fluid
attenuation cysts in both kidneys. No suspicious renal lesion,
urolithiasis or hydronephrosis. Urinary bladder is unremarkable for
degree of distension.

Stomach/Bowel: Distal esophagus, stomach and duodenal sweep are
unremarkable. No small bowel wall thickening or dilatation. No
evidence of obstruction. Appendix is air-filled and nondilated. No
colonic dilatation or wall thickening. Redundancy of the sigmoid. No
evidence of bowel obstruction.

Vascular/Lymphatic: Atherosclerotic plaque throughout the abdominal
aorta and branch vessels. Conspicuous adenopathy is seen in the
porta hepatis and retroperitoneum including a 10 mm aortocaval node
(2/34), and a prominent 7 mm node in the porta hepatis ([DATE]).

Reproductive: The prostate and seminal vesicles are unremarkable.

Other: Edematous changes noted in the upper abdomen. No
abdominopelvic free air or fluid. No bowel containing hernias.

Musculoskeletal: No acute osseous abnormality or suspicious osseous
lesion. Multilevel degenerative changes are present in the imaged
portions of the spine. Transitional lumbosacral vertebrae with bony
fusion of the adjacent sacral ala and superior SI joints. Additional
degenerative changes in the hips and pelvis.
IMPRESSION: 1. Ill-defined hypoattenuating lesions present in the left lobe,
caudate and right lobe liver with there are focal internal cystic
foci. Appearance is highly concerning for metastatic disease or
neoplasm. Additionally, there appears to be tumor thrombus extension
into the main portal vein. The left portal vein appears entirely
occluded. Partial occlusion of the proximal right portal and
high-grade narrowing/near occlusion of the right anterior portal
branches.
2. Conspicuous adenopathy in the porta hepatis and retroperitoneum,
concerning for metastatic disease.
3. Lobular, irregular liver surface contour, nonspecific could
reflect cirrhosis or intrinsic liver disease.
4. Mild gallbladder wall thickening and pericholecystic
inflammation, nonspecific given the liver findings. Could correlate
with clinical symptoms and consider further evaluation with right
upper quadrant ultrasound as clinically indicated.
5. Aortic Atherosclerosis (1NMEX-70C.C).

These results were called by telephone at the time of interpretation
on 03/26/2020 at [DATE] to provider MUHAMMADRASUL PRIMBETOV , who verbally
acknowledged these results.

## 2022-04-20 IMAGING — US US ABDOMEN LIMITED
1 series · 14 of 25 positions shown · non-contrast
Comparison: CT 03/26/2020.

CLINICAL DATA: Abnormal CT.

EXAM:
ULTRASOUND ABDOMEN LIMITED RIGHT UPPER QUADRANT

[Series 1: us abdomen limited · 14 of 68 slices shown]
[im 1/68]
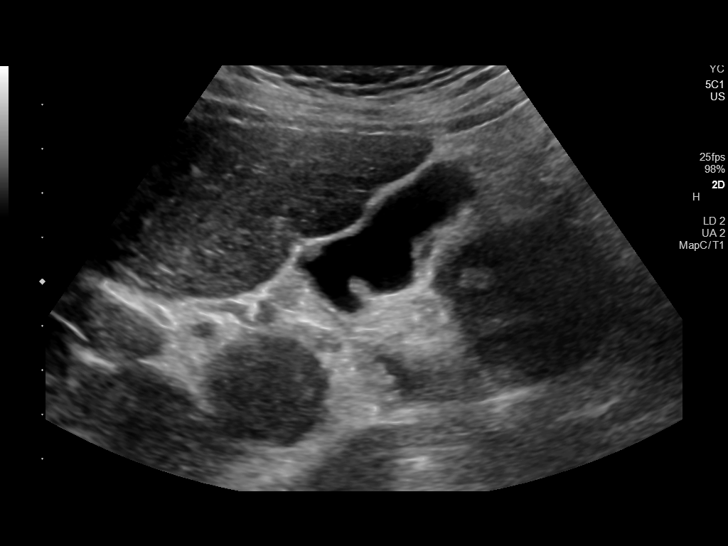
[im 6/68]
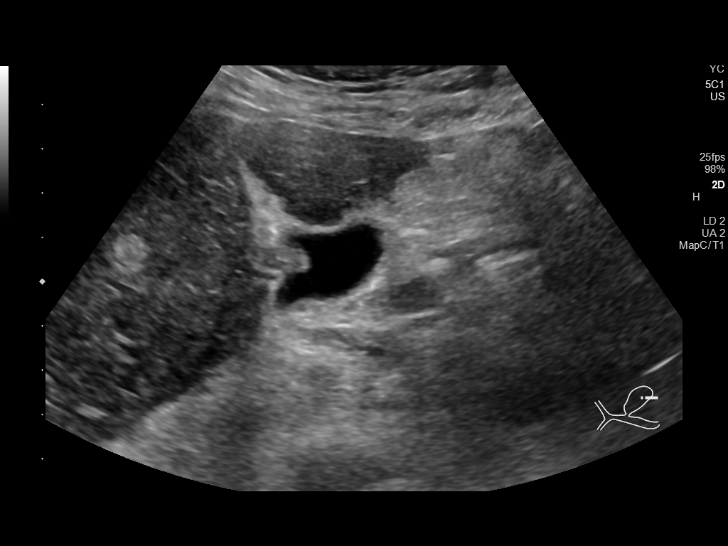
[im 12/68]
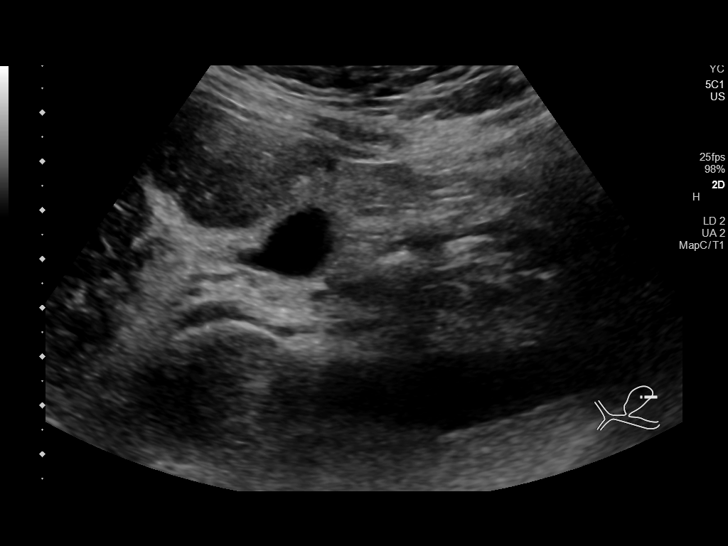
[im 17/68]
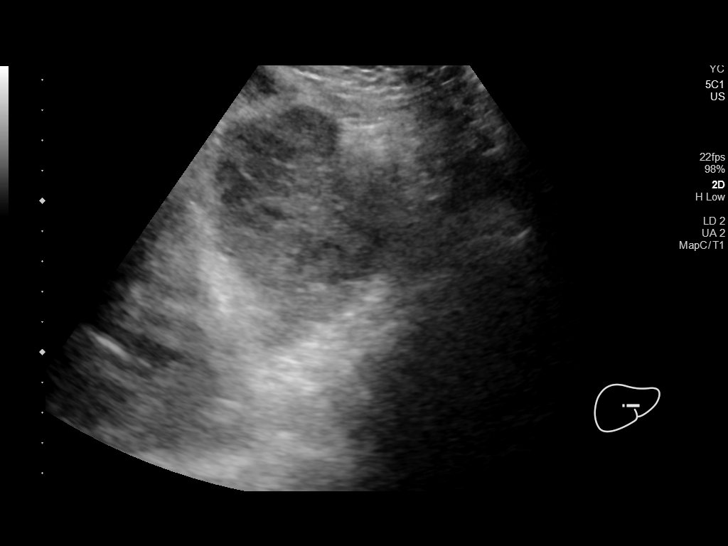
[im 23/68]
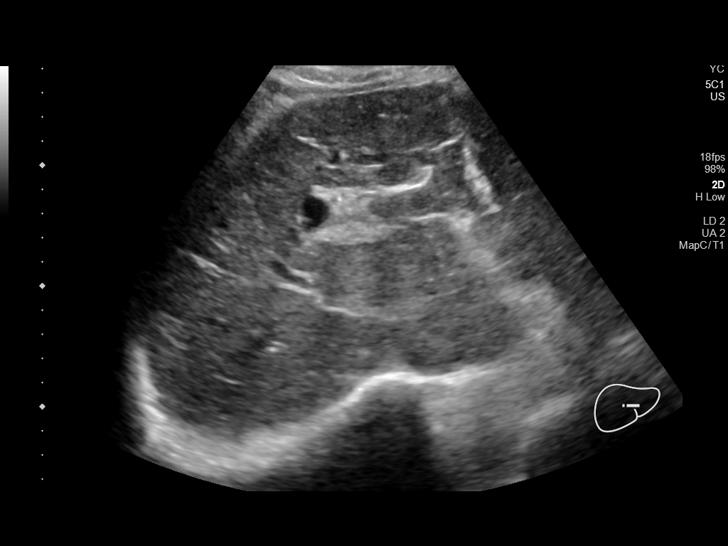
[im 26/68]
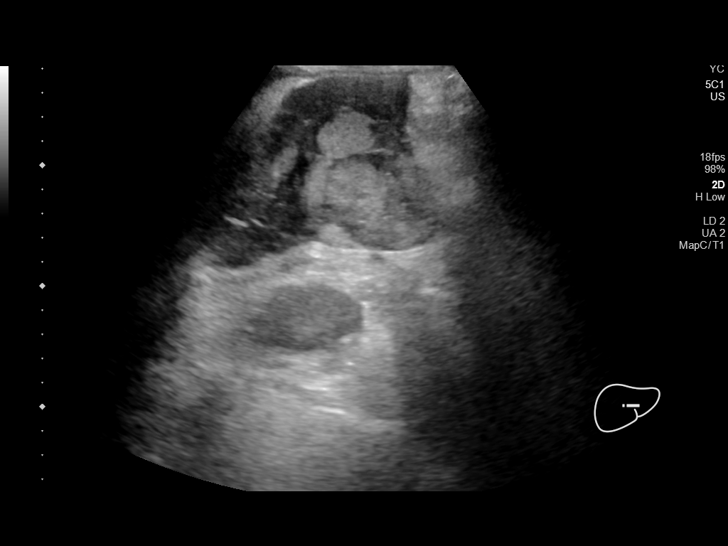
[im 31/68]
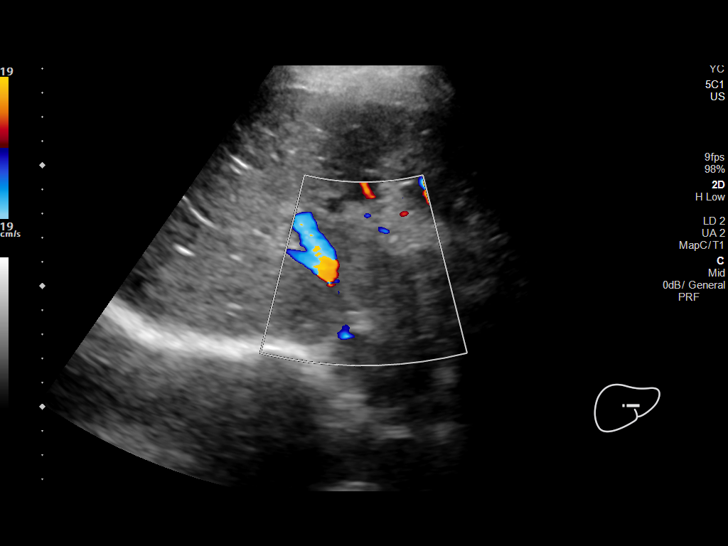
[im 37/68]
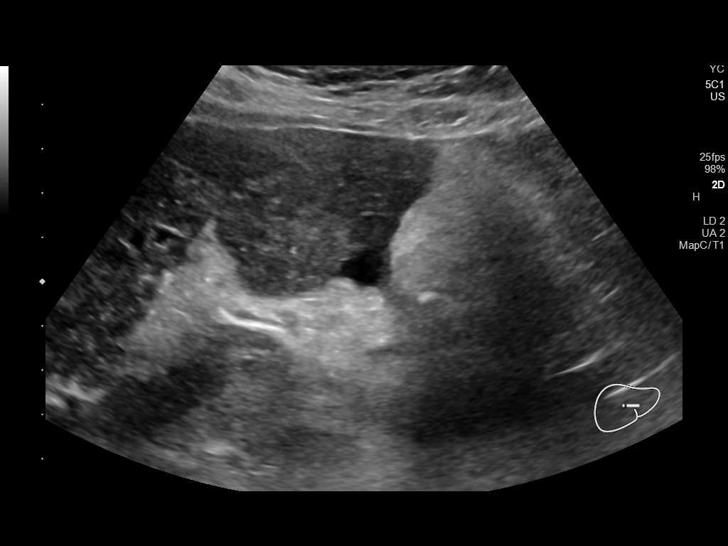
[im 42/68]
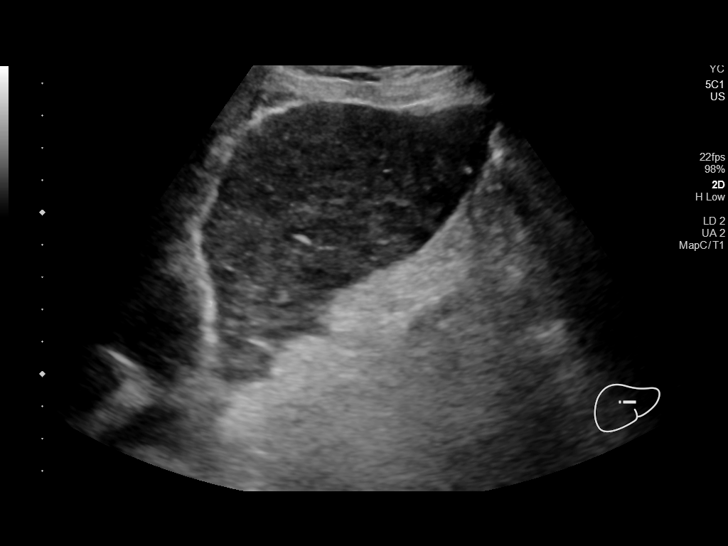
[im 45/68]
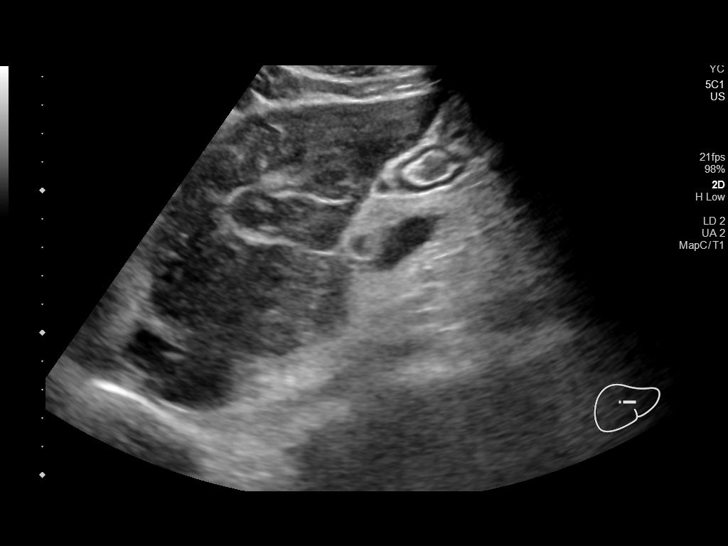
[im 51/68]
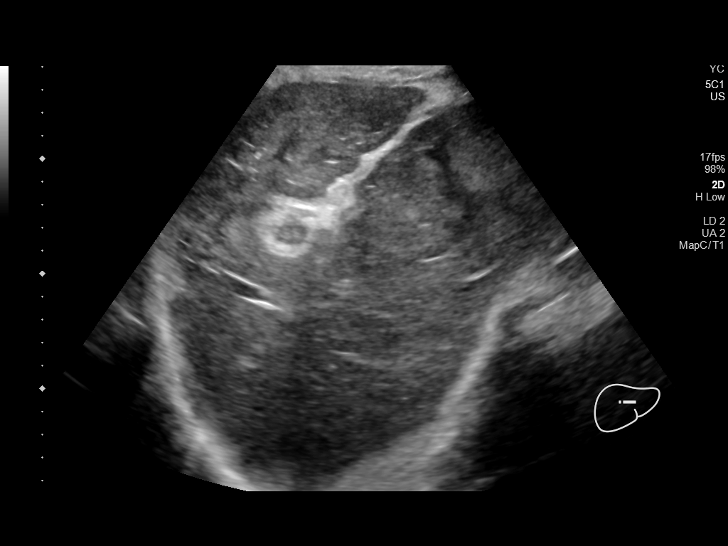
[im 56/68]
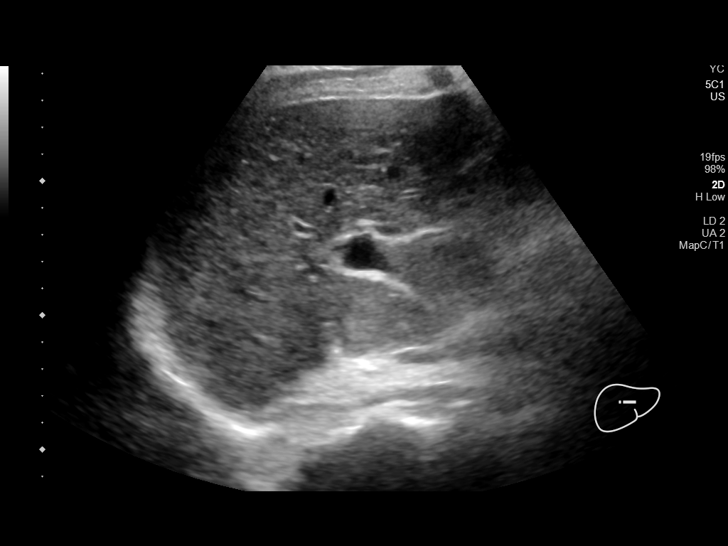
[im 62/68]
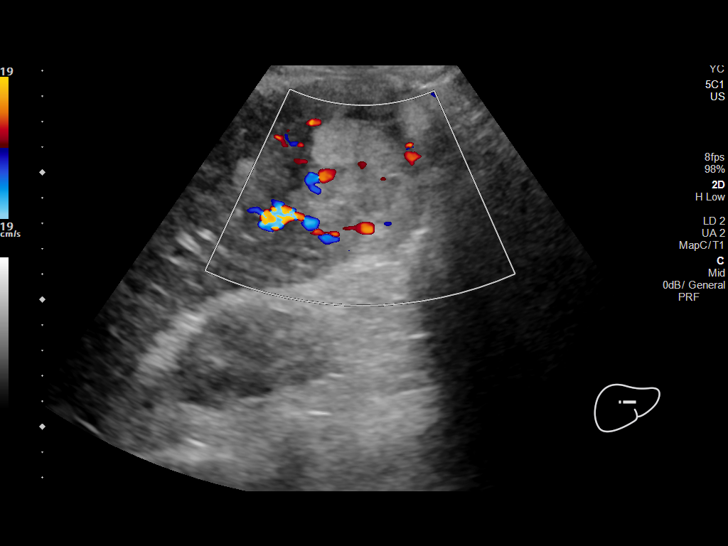
[im 68/68]
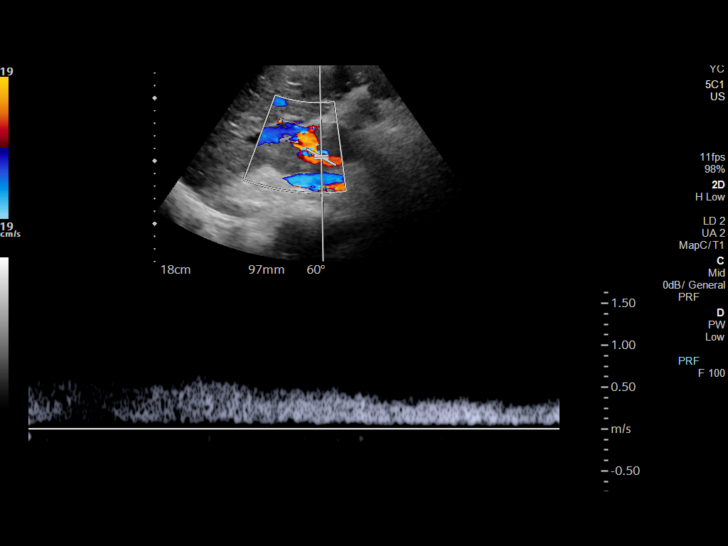

[14 of 25 positions shown; findings below may reference images not displayed]

FINDINGS: Gallbladder:

No gallstones or wall thickening visualized. No sonographic Murphy
sign noted by sonographer.

Common bile duct:

Diameter: 2.2 mm

Liver:

Nodular very heterogeneous hepatic contour suggesting cirrhosis.
cm mass noted in the right hepatic lobe. Left hepatic and caudate
hepatic masses best identified by prior CT. Portal vein thrombosis
best identified by prior CT.
IMPRESSION: 1. No gallstones or biliary distention.
2. Nodular hepatic contour suggesting cirrhosis. 5.8 cm mass noted
in the right hepatic lobe. Left hepatic and caudate hepatic masses
best identified by prior CT. Portal vein thrombosis best identified
by prior CT.
